# Patient Record
Sex: Female | Born: 1960 | Race: Black or African American | Hispanic: No | Smoking: Former smoker
Health system: Southern US, Community
[De-identification: ages and names within clinical notes are randomized; demographics above are authoritative.]

## PROBLEM LIST (undated history)

## (undated) DIAGNOSIS — E785 Hyperlipidemia, unspecified: Secondary | ICD-10-CM

## (undated) DIAGNOSIS — R519 Headache, unspecified: Secondary | ICD-10-CM

## (undated) DIAGNOSIS — F419 Anxiety disorder, unspecified: Secondary | ICD-10-CM

## (undated) DIAGNOSIS — IMO0002 Reserved for concepts with insufficient information to code with codable children: Secondary | ICD-10-CM

## (undated) DIAGNOSIS — D649 Anemia, unspecified: Secondary | ICD-10-CM

## (undated) DIAGNOSIS — K209 Esophagitis, unspecified: Secondary | ICD-10-CM

## (undated) DIAGNOSIS — M1990 Unspecified osteoarthritis, unspecified site: Secondary | ICD-10-CM

## (undated) DIAGNOSIS — K219 Gastro-esophageal reflux disease without esophagitis: Secondary | ICD-10-CM

## (undated) DIAGNOSIS — K449 Diaphragmatic hernia without obstruction or gangrene: Secondary | ICD-10-CM

## (undated) DIAGNOSIS — K222 Esophageal obstruction: Secondary | ICD-10-CM

## (undated) DIAGNOSIS — I10 Essential (primary) hypertension: Secondary | ICD-10-CM

## (undated) DIAGNOSIS — T7840XA Allergy, unspecified, initial encounter: Secondary | ICD-10-CM

## (undated) DIAGNOSIS — R51 Headache: Secondary | ICD-10-CM

## (undated) DIAGNOSIS — K589 Irritable bowel syndrome without diarrhea: Secondary | ICD-10-CM

## (undated) DIAGNOSIS — K635 Polyp of colon: Secondary | ICD-10-CM

## (undated) DIAGNOSIS — G8929 Other chronic pain: Secondary | ICD-10-CM

## (undated) HISTORY — DX: Esophageal obstruction: K22.2

## (undated) HISTORY — DX: Hyperlipidemia, unspecified: E78.5

## (undated) HISTORY — DX: Essential (primary) hypertension: I10

## (undated) HISTORY — DX: Headache, unspecified: R51.9

## (undated) HISTORY — DX: Headache: R51

## (undated) HISTORY — PX: ANKLE FRACTURE SURGERY: SHX122

## (undated) HISTORY — PX: BUNIONECTOMY: SHX129

## (undated) HISTORY — DX: Reserved for concepts with insufficient information to code with codable children: IMO0002

## (undated) HISTORY — DX: Other chronic pain: G89.29

## (undated) HISTORY — DX: Gastro-esophageal reflux disease without esophagitis: K21.9

## (undated) HISTORY — DX: Diaphragmatic hernia without obstruction or gangrene: K44.9

## (undated) HISTORY — DX: Anemia, unspecified: D64.9

## (undated) HISTORY — DX: Anxiety disorder, unspecified: F41.9

## (undated) HISTORY — PX: ABDOMINAL HYSTERECTOMY: SHX81

## (undated) HISTORY — DX: Polyp of colon: K63.5

## (undated) HISTORY — PX: BREAST CYST EXCISION: SHX579

## (undated) HISTORY — DX: Unspecified osteoarthritis, unspecified site: M19.90

## (undated) HISTORY — DX: Irritable bowel syndrome, unspecified: K58.9

## (undated) HISTORY — DX: Esophagitis, unspecified: K20.9

## (undated) HISTORY — DX: Allergy, unspecified, initial encounter: T78.40XA

## (undated) HISTORY — PX: TOTAL ABDOMINAL HYSTERECTOMY: SHX209

## (undated) HISTORY — PX: KNEE ARTHROSCOPY: SUR90

---

## 1990-06-28 DIAGNOSIS — K449 Diaphragmatic hernia without obstruction or gangrene: Secondary | ICD-10-CM

## 1990-06-28 DIAGNOSIS — K209 Esophagitis, unspecified without bleeding: Secondary | ICD-10-CM

## 1990-06-28 DIAGNOSIS — K222 Esophageal obstruction: Secondary | ICD-10-CM

## 1990-06-28 HISTORY — DX: Diaphragmatic hernia without obstruction or gangrene: K44.9

## 1990-06-28 HISTORY — DX: Esophagitis, unspecified without bleeding: K20.90

## 1990-06-28 HISTORY — DX: Esophageal obstruction: K22.2

## 1998-12-23 ENCOUNTER — Ambulatory Visit (HOSPITAL_COMMUNITY): Admission: RE | Admit: 1998-12-23 | Discharge: 1998-12-23 | Payer: Self-pay | Admitting: Pulmonary Disease

## 1998-12-23 ENCOUNTER — Encounter: Payer: Self-pay | Admitting: Pulmonary Disease

## 1998-12-23 ENCOUNTER — Observation Stay (HOSPITAL_COMMUNITY): Admission: AD | Admit: 1998-12-23 | Discharge: 1998-12-24 | Payer: Self-pay | Admitting: Obstetrics and Gynecology

## 1999-01-02 ENCOUNTER — Ambulatory Visit (HOSPITAL_COMMUNITY): Admission: RE | Admit: 1999-01-02 | Discharge: 1999-01-02 | Payer: Self-pay | Admitting: Obstetrics and Gynecology

## 1999-01-27 ENCOUNTER — Other Ambulatory Visit: Admission: RE | Admit: 1999-01-27 | Discharge: 1999-01-27 | Payer: Self-pay | Admitting: Obstetrics and Gynecology

## 1999-11-12 ENCOUNTER — Inpatient Hospital Stay (HOSPITAL_COMMUNITY): Admission: RE | Admit: 1999-11-12 | Discharge: 1999-11-14 | Payer: Self-pay | Admitting: *Deleted

## 2000-05-09 ENCOUNTER — Other Ambulatory Visit: Admission: RE | Admit: 2000-05-09 | Discharge: 2000-05-09 | Payer: Self-pay | Admitting: *Deleted

## 2001-01-31 ENCOUNTER — Encounter: Admission: RE | Admit: 2001-01-31 | Discharge: 2001-01-31 | Payer: Self-pay | Admitting: Pulmonary Disease

## 2001-01-31 ENCOUNTER — Encounter: Payer: Self-pay | Admitting: Pulmonary Disease

## 2001-06-19 ENCOUNTER — Other Ambulatory Visit: Admission: RE | Admit: 2001-06-19 | Discharge: 2001-06-19 | Payer: Self-pay | Admitting: *Deleted

## 2003-04-11 ENCOUNTER — Other Ambulatory Visit: Admission: RE | Admit: 2003-04-11 | Discharge: 2003-04-11 | Payer: Self-pay | Admitting: *Deleted

## 2005-02-23 ENCOUNTER — Ambulatory Visit: Payer: Self-pay | Admitting: Pulmonary Disease

## 2005-04-26 ENCOUNTER — Ambulatory Visit: Payer: Self-pay | Admitting: Pulmonary Disease

## 2006-03-24 ENCOUNTER — Ambulatory Visit: Payer: Self-pay | Admitting: Pulmonary Disease

## 2006-09-20 ENCOUNTER — Ambulatory Visit: Payer: Self-pay | Admitting: Pulmonary Disease

## 2006-09-20 LAB — CONVERTED CEMR LAB
ALT: 18 units/L (ref 0–40)
AST: 21 units/L (ref 0–37)
Albumin: 4.4 g/dL (ref 3.5–5.2)
Alkaline Phosphatase: 85 units/L (ref 39–117)
BUN: 8 mg/dL (ref 6–23)
Basophils Absolute: 0 10*3/uL (ref 0.0–0.1)
Basophils Relative: 0.6 % (ref 0.0–1.0)
Bilirubin, Direct: 0.1 mg/dL (ref 0.0–0.3)
CO2: 29 meq/L (ref 19–32)
Calcium: 9.7 mg/dL (ref 8.4–10.5)
Chloride: 105 meq/L (ref 96–112)
Cholesterol: 166 mg/dL (ref 0–200)
Creatinine, Ser: 0.6 mg/dL (ref 0.4–1.2)
Eosinophils Absolute: 0.1 10*3/uL (ref 0.0–0.6)
Eosinophils Relative: 1 % (ref 0.0–5.0)
GFR calc Af Amer: 138 mL/min
GFR calc non Af Amer: 114 mL/min
Glucose, Bld: 111 mg/dL — ABNORMAL HIGH (ref 70–99)
HCT: 44.9 % (ref 36.0–46.0)
HDL: 64.5 mg/dL (ref >39.0)
Hemoglobin: 15.4 g/dL — ABNORMAL HIGH (ref 12.0–15.0)
LDL Cholesterol: 85 mg/dL (ref 0–99)
Lymphocytes Relative: 26.5 % (ref 12.0–46.0)
MCHC: 34.2 g/dL (ref 30.0–36.0)
MCV: 87.6 fL (ref 78.0–100.0)
Monocytes Absolute: 0.3 10*3/uL (ref 0.2–0.7)
Monocytes Relative: 4.2 % (ref 3.0–11.0)
Neutro Abs: 5.7 10*3/uL (ref 1.4–7.7)
Neutrophils Relative %: 67.7 % (ref 43.0–77.0)
Platelets: 218 10*3/uL (ref 150–400)
Potassium: 3.9 meq/L (ref 3.5–5.1)
RBC: 5.13 M/uL — ABNORMAL HIGH (ref 3.87–5.11)
RDW: 14.1 % (ref 11.5–14.6)
Sodium: 142 meq/L (ref 135–145)
TSH: 0.97 microintl units/mL (ref 0.35–5.50)
Total Bilirubin: 0.9 mg/dL (ref 0.3–1.2)
Total CHOL/HDL Ratio: 2.6
Total Protein: 7.2 g/dL (ref 6.0–8.3)
Triglycerides: 85 mg/dL (ref 0–149)
VLDL: 17 mg/dL (ref 0–40)
WBC: 8.3 10*3/uL (ref 4.5–10.5)

## 2007-09-15 DIAGNOSIS — K219 Gastro-esophageal reflux disease without esophagitis: Secondary | ICD-10-CM | POA: Insufficient documentation

## 2007-09-15 DIAGNOSIS — E785 Hyperlipidemia, unspecified: Secondary | ICD-10-CM | POA: Insufficient documentation

## 2007-09-15 DIAGNOSIS — Z8719 Personal history of other diseases of the digestive system: Secondary | ICD-10-CM | POA: Insufficient documentation

## 2011-06-29 DEATH — deceased

## 2012-08-01 ENCOUNTER — Encounter: Payer: Self-pay | Admitting: Obstetrics and Gynecology

## 2012-10-06 ENCOUNTER — Encounter: Payer: Self-pay | Admitting: Gastroenterology

## 2012-10-23 ENCOUNTER — Encounter: Payer: Self-pay | Admitting: *Deleted

## 2012-10-27 ENCOUNTER — Encounter: Payer: Self-pay | Admitting: Gastroenterology

## 2012-10-27 ENCOUNTER — Ambulatory Visit (INDEPENDENT_AMBULATORY_CARE_PROVIDER_SITE_OTHER): Payer: Federal, State, Local not specified - PPO | Admitting: Gastroenterology

## 2012-10-27 VITALS — BP 110/86 | HR 80 | Ht 68.25 in | Wt 170.2 lb

## 2012-10-27 DIAGNOSIS — K644 Residual hemorrhoidal skin tags: Secondary | ICD-10-CM

## 2012-10-27 DIAGNOSIS — G8929 Other chronic pain: Secondary | ICD-10-CM

## 2012-10-27 DIAGNOSIS — K589 Irritable bowel syndrome without diarrhea: Secondary | ICD-10-CM

## 2012-10-27 DIAGNOSIS — M549 Dorsalgia, unspecified: Secondary | ICD-10-CM

## 2012-10-27 DIAGNOSIS — K219 Gastro-esophageal reflux disease without esophagitis: Secondary | ICD-10-CM

## 2012-10-27 DIAGNOSIS — Z860100 Personal history of colon polyps, unspecified: Secondary | ICD-10-CM

## 2012-10-27 DIAGNOSIS — R1013 Epigastric pain: Secondary | ICD-10-CM

## 2012-10-27 DIAGNOSIS — Z800 Family history of malignant neoplasm of digestive organs: Secondary | ICD-10-CM

## 2012-10-27 DIAGNOSIS — K5900 Constipation, unspecified: Secondary | ICD-10-CM

## 2012-10-27 MED ORDER — SUCRALFATE 1 G PO TABS: 1.0000 g | ORAL_TABLET | Freq: Every day | ORAL | Status: DC

## 2012-10-27 MED ORDER — ESOMEPRAZOLE MAGNESIUM 40 MG PO CPDR: 40.0000 mg | DELAYED_RELEASE_CAPSULE | Freq: Two times a day (BID) | ORAL | Status: DC

## 2012-10-27 NOTE — Patient Instructions (Addendum)
You have been given a separate informational sheet regarding your tobacco use, the importance of quitting and local resources to help you quit.  You have been scheduled for an endoscopy with propofol. Please follow written instructions given to you at your visit today. If you use inhalers (even only as needed), please bring them with you on the day of your procedure. Your physician has requested that you go to www.startemmi.com and enter the access code given to you at your visit today. This web site gives a general overview about your procedure. However, you should still follow specific instructions given to you by our office regarding your preparation for the procedure.  Carafate was sent to your pharmacy, please take one tablet at bedtime.  Please take Nexium one capsule by mouth twice daily. New prescription   Please purchase Metamucil over the counter. Take as directed.  We will get a copy of your labs and Colonoscopy report from your primary care doctor. _____________________________________________________________________________________________________________________________________   Monica Brooks have been scheduled for an abdominal ultrasound at [facility] (1st floor of hospital) on 10-30-2012 at 8 am. Please arrive 15 minutes prior to your appointment for registration. Make certain not to have anything to eat or drink 6 hours prior to your appointment. Should you need to reschedule your appointment, please contact radiology at [phone]. This test typically takes about 30 minutes to perform.

## 2012-10-27 NOTE — Progress Notes (Signed)
History of Present Illness:  This is a very pleasant 52 year old African American female who has had chronic GERD for many years and takes daily Nexium 40 mg.  For the last 2-3 months she's had  early morning nausea, dyspepsia, and epigastric abdominal pain made worse by use of certain foods.  She's had no emesis, weight loss, melena or hematochezia.  Patient does have a history of external hemorrhoids, but denies rectal bleeding.  Colonoscopy was apparently done at [facility] 2 years ago with 2 polyps removed.  We will request these records for review.  Apparently she also has had recent lab data which was normal, and we will request these records for review.  Apparently saw this patient 34, but cannot find these records for review.  Allegedly that time she had" an ulcer".  She has burning epigastric pain radiating to her chest without referral into her back.  She denies dysphagia, but does have gas and bloating, chronic constipation with occasional diarrhea.  There is no history of known gallbladder liver disease.  Family history is remarkable for colon cancer in her grandmother, and colon polyps in her father.  Does smokes a half a pack of cigarettes per day, but denies ethanol use.  She has chronic low back pain and yakes Percocet regularly with when necessary Celebrex 200 mg.  I have reviewed this patient's present history, medical and surgical past history, allergies and medications.     ROS:   All systems were reviewed and are negative unless otherwise stated in the HPI.Marland KitchenMarland KitchenBlood pressure 110/86, pulse 80, height 5' 8.25" (1.734 m), weight 170 lb 4 oz (77.225 kg), SpO2 98.00%.-shaped right low back pain with herniated disc, pain in her left foot, and a history of carpal tunnel syndrome.  She is status post hysterectomy.  There is no history of fever, chills, or other systemic complaints.    Physical Exam: Blood pressure 110/86, pulse 80 and regular, and weight 170 with a BMI of  25.68.  98% oxygen saturation on room air.  General well developed well nourished patient in no acute distress, appearing their stated age Eyes PERRLA, no icterus, fundoscopic exam per opthamologist Skin no lesions noted Neck supple, no adenopathy, no thyroid enlargement, no tenderness Chest clear to percussion and auscultation Heart no significant murmurs, gallops or rubs noted Abdomen no hepatosplenomegaly masses or tenderness, BS normal.  Rectal inspection normal no fissures, or fistulae noted.  No masses or tenderness on digital exam. Stool guaiac negative.V-shaped scar at umbilicus from previous umbilical hernia repair as a child. Extremities no acute joint lesions, edema, phlebitis or evidence of cellulitis. Neurologic patient oriented x 3, cranial nerves intact, no focal neurologic deficits noted. Psychological mental status normal and normal affect.  Assessment and plan: Chronic constipation predominant IBS, and the patient does use large amount of by mouth sorbitol and fructose, and we will try to remove these products from her diet and use daily Metamucil and liberal by mouth fluids.  Inspection of her rectum does show a large posterior midline hemorrhoid.  As mentioned above, she does have a history of colon polyps and a family history of colon cancer.  I suspect she has worsening acid reflux symptoms, but may have H. pylori infection, and I have scheduled her for endoscopic exam, also upper abdominal ultrasound to exclude cholelithiasis.  I've increased her Nexium to 40 mg twice a day with Carafate 1 g at bedtime.  Is also possible that there is an element of NSAID induced gastropathy  spiked daily PPI use. We have requested  review of her colonoscopy report and laboratory data.  She may need closer followup than every five-year colonoscopy.  No diagnosis found.

## 2012-10-30 ENCOUNTER — Ambulatory Visit (HOSPITAL_COMMUNITY): Admission: RE | Admit: 2012-10-30 | Payer: Federal, State, Local not specified - PPO | Source: Ambulatory Visit

## 2012-10-30 ENCOUNTER — Telehealth: Payer: Self-pay | Admitting: Gastroenterology

## 2012-10-30 NOTE — Telephone Encounter (Signed)
Order faxed Patient aware.  She states that she will call them to set up the appt

## 2012-11-03 ENCOUNTER — Encounter: Payer: Self-pay | Admitting: Gastroenterology

## 2012-11-03 ENCOUNTER — Ambulatory Visit (AMBULATORY_SURGERY_CENTER): Payer: Federal, State, Local not specified - PPO | Admitting: Gastroenterology

## 2012-11-03 VITALS — BP 158/96 | HR 65 | Temp 97.2°F | Resp 19 | Ht 68.5 in | Wt 170.0 lb

## 2012-11-03 DIAGNOSIS — K219 Gastro-esophageal reflux disease without esophagitis: Secondary | ICD-10-CM

## 2012-11-03 DIAGNOSIS — K2970 Gastritis, unspecified, without bleeding: Secondary | ICD-10-CM

## 2012-11-03 DIAGNOSIS — Z800 Family history of malignant neoplasm of digestive organs: Secondary | ICD-10-CM

## 2012-11-03 DIAGNOSIS — R1013 Epigastric pain: Secondary | ICD-10-CM

## 2012-11-03 DIAGNOSIS — K222 Esophageal obstruction: Secondary | ICD-10-CM

## 2012-11-03 MED ORDER — SODIUM CHLORIDE 0.9 % IV SOLN: 500.0000 mL | INTRAVENOUS | Status: DC

## 2012-11-03 NOTE — Progress Notes (Signed)
Patient did not experience any of the following events: a burn prior to discharge; a fall within the facility; wrong site/side/patient/procedure/implant event; or a hospital transfer or hospital admission upon discharge from the facility. (G8907) Patient did not have preoperative order for IV antibiotic SSI prophylaxis. (G8918)  

## 2012-11-03 NOTE — Progress Notes (Signed)
Called to room to assist during endoscopic procedure.  Patient ID and intended procedure confirmed with present staff. Received instructions for my participation in the procedure from the performing physician.  

## 2012-11-03 NOTE — Patient Instructions (Addendum)

## 2012-11-03 NOTE — Op Note (Addendum)
[facility] [address] [place], [address]   ENDOSCOPY PROCEDURE REPORT  PATIENT: Monica, Brooks.  MR#: [id number] BIRTHDATE: 03-13-1961 , 51  yrs. old GENDER: Female ENDOSCOPIST:Janal Haak Hale Bogus, MD, [facility] REFERRED BY: PROCEDURE DATE:  11/03/2012 PROCEDURE:   EGD w/ biopsy for H.pylori and Maloney dilation of esophagus ASA CLASS:    Class II INDICATIONS: Dysphagia and History of reflux esophagitis. MEDICATION: propofol (Diprivan) 150mg  IV TOPICAL ANESTHETIC:   Cetacaine Spray  DESCRIPTION OF PROCEDURE:   After the risks and benefits of the procedure were explained, informed consent was obtained.  The endoscope was introduced through the mouth  and advanced to the second portion of the duodenum .  The instrument was slowly withdrawn as the mucosa was fully examined.      DUODENUM: The duodenal mucosa showed no abnormalities in the bulb and second portion of the duodenum.  STOMACH: There was mild antral gastropathy noted.  Cold forcep biopsies were taken at the antrum.  for CLO testing.  ESOPHAGUS: The mucosa of the esophagus appeared normal.   A stricture was found at the gastroesophageal junction.  The stenosis was traversable with the endoscope.  Dilated #20F  maloney dilator...slight resistance,no heme.  Retroflexed views revealed a small hiatal hernia.    The scope was then withdrawn from the patient and the procedure completed.  COMPLICATIONS: There were no complications.   ENDOSCOPIC IMPRESSION: 1.   The duodenal mucosa showed no abnormalities in the bulb and second portion of the duodenum 2.   There was mild antral gastropathy noted [T2]..r/o H.pylori 3.   The mucosa of the esophagus appeared normal ..pt. on PPi Rx. Chronic GERD 4.   Stricture was found at the gastroesophageal junction ...dilated #20F  maloney dilator...  RECOMMENDATIONS: 1.  Await biopsy results 2.  Clear liquids until 5:00 PM , then soft foods rest iof day. Resume  prior diet tomorrow. 3.  OP follow-up is advised on a PRN basis. 4.  Rx CLO if positive 5.  Continue current meds    _______________________________ eSigned:  Mardella Layman, MD, [facility] 12/06/2012 2:06 PM Revised: 12/06/2012 2:06 PM     PATIENT NAME:  Monica Brooks. MR#: [id number]

## 2012-11-04 ENCOUNTER — Encounter (HOSPITAL_COMMUNITY): Payer: Self-pay | Admitting: Emergency Medicine

## 2012-11-04 ENCOUNTER — Inpatient Hospital Stay (HOSPITAL_COMMUNITY)
Admission: EM | Admit: 2012-11-04 | Discharge: 2012-11-05 | DRG: 452 | Disposition: A | Discharge status: Home / Self Care | Payer: Federal, State, Local not specified - PPO | Attending: Internal Medicine | Admitting: Internal Medicine

## 2012-11-04 ENCOUNTER — Emergency Department (HOSPITAL_COMMUNITY): Payer: Federal, State, Local not specified - PPO

## 2012-11-04 ENCOUNTER — Other Ambulatory Visit: Payer: Self-pay

## 2012-11-04 ENCOUNTER — Telehealth: Payer: Self-pay | Admitting: Internal Medicine

## 2012-11-04 DIAGNOSIS — Z860100 Personal history of colon polyps, unspecified: Secondary | ICD-10-CM

## 2012-11-04 DIAGNOSIS — I369 Nonrheumatic tricuspid valve disorder, unspecified: Secondary | ICD-10-CM

## 2012-11-04 DIAGNOSIS — D62 Acute posthemorrhagic anemia: Secondary | ICD-10-CM | POA: Diagnosis present

## 2012-11-04 DIAGNOSIS — E785 Hyperlipidemia, unspecified: Secondary | ICD-10-CM | POA: Diagnosis present

## 2012-11-04 DIAGNOSIS — K222 Esophageal obstruction: Secondary | ICD-10-CM

## 2012-11-04 DIAGNOSIS — I10 Essential (primary) hypertension: Secondary | ICD-10-CM | POA: Diagnosis present

## 2012-11-04 DIAGNOSIS — Z8711 Personal history of peptic ulcer disease: Secondary | ICD-10-CM

## 2012-11-04 DIAGNOSIS — K920 Hematemesis: Secondary | ICD-10-CM | POA: Diagnosis present

## 2012-11-04 DIAGNOSIS — I517 Cardiomegaly: Secondary | ICD-10-CM | POA: Diagnosis present

## 2012-11-04 DIAGNOSIS — R55 Syncope and collapse: Secondary | ICD-10-CM

## 2012-11-04 DIAGNOSIS — K219 Gastro-esophageal reflux disease without esophagitis: Secondary | ICD-10-CM

## 2012-11-04 DIAGNOSIS — F17200 Nicotine dependence, unspecified, uncomplicated: Secondary | ICD-10-CM | POA: Diagnosis present

## 2012-11-04 DIAGNOSIS — R000 Tachycardia, unspecified: Secondary | ICD-10-CM | POA: Diagnosis present

## 2012-11-04 DIAGNOSIS — Z79899 Other long term (current) drug therapy: Secondary | ICD-10-CM

## 2012-11-04 DIAGNOSIS — IMO0002 Reserved for concepts with insufficient information to code with codable children: Principal | ICD-10-CM | POA: Diagnosis present

## 2012-11-04 DIAGNOSIS — F411 Generalized anxiety disorder: Secondary | ICD-10-CM | POA: Diagnosis present

## 2012-11-04 LAB — CBC
HCT: 28.3 % — ABNORMAL LOW (ref 36.0–46.0)
HCT: 26.9 % — ABNORMAL LOW (ref 36.0–46.0)
HCT: 26.4 % — ABNORMAL LOW (ref 36.0–46.0)
Hemoglobin: 9.6 g/dL — ABNORMAL LOW (ref 12.0–15.0)
Hemoglobin: 8.8 g/dL — ABNORMAL LOW (ref 12.0–15.0)
Hemoglobin: 8.8 g/dL — ABNORMAL LOW (ref 12.0–15.0)
MCH: 30.2 pg (ref 26.0–34.0)
MCH: 29.7 pg (ref 26.0–34.0)
MCHC: 33.9 g/dL (ref 30.0–36.0)
MCHC: 33.3 g/dL (ref 30.0–36.0)
MCV: 89 fL (ref 78.0–100.0)
MCV: 89.2 fL (ref 78.0–100.0)
Platelets: 171 10*3/uL (ref 150–400)
Platelets: 175 10*3/uL (ref 150–400)
RBC: 3.18 MIL/uL — ABNORMAL LOW (ref 3.87–5.11)
RBC: 2.96 MIL/uL — ABNORMAL LOW (ref 3.87–5.11)
RDW: 13.3 % (ref 11.5–15.5)
RDW: 13.8 % (ref 11.5–15.5)
WBC: 10.1 10*3/uL (ref 4.0–10.5)
WBC: 9.2 10*3/uL (ref 4.0–10.5)
WBC: 10.7 10*3/uL — ABNORMAL HIGH (ref 4.0–10.5)

## 2012-11-04 LAB — CBC WITH DIFFERENTIAL/PLATELET
Eosinophils Absolute: 0 10*3/uL (ref 0.0–0.7)
Eosinophils Relative: 0 % (ref 0–5)
HCT: 37.1 % (ref 36.0–46.0)
Lymphocytes Relative: 21 % (ref 12–46)
Lymphs Abs: 2.8 10*3/uL (ref 0.7–4.0)
MCH: 30.6 pg (ref 26.0–34.0)
MCV: 89.4 fL (ref 78.0–100.0)
Monocytes Absolute: 0.7 10*3/uL (ref 0.1–1.0)
Platelets: 226 10*3/uL (ref 150–400)
RBC: 4.15 MIL/uL (ref 3.87–5.11)
RDW: 13.4 % (ref 11.5–15.5)

## 2012-11-04 LAB — POCT I-STAT, CHEM 8
BUN: 38 mg/dL — ABNORMAL HIGH (ref 6–23)
Creatinine, Ser: 0.6 mg/dL (ref 0.50–1.10)
Glucose, Bld: 139 mg/dL — ABNORMAL HIGH (ref 70–99)
Hemoglobin: 11.9 g/dL — ABNORMAL LOW (ref 12.0–15.0)
Potassium: 3.6 mEq/L (ref 3.5–5.1)
Sodium: 143 mEq/L (ref 135–145)

## 2012-11-04 LAB — BASIC METABOLIC PANEL
CO2: 28 mEq/L (ref 19–32)
Calcium: 9.2 mg/dL (ref 8.4–10.5)
Chloride: 104 mEq/L (ref 96–112)
Creatinine, Ser: 0.57 mg/dL (ref 0.50–1.10)
Glucose, Bld: 137 mg/dL — ABNORMAL HIGH (ref 70–99)

## 2012-11-04 LAB — TROPONIN I
Troponin I: <0.3 ng/mL (ref <0.30)
Troponin I: <0.3 ng/mL
Troponin I: <0.3 ng/mL (ref <0.30)

## 2012-11-04 LAB — MRSA PCR SCREENING: MRSA by PCR: NEGATIVE

## 2012-11-04 LAB — TYPE AND SCREEN: ABO/RH(D): O POS

## 2012-11-04 MED ORDER — PANTOPRAZOLE SODIUM 40 MG IV SOLR
40.0000 mg | Freq: Two times a day (BID) | INTRAVENOUS | Status: DC
  Administered 2012-11-04 (×2): 40 mg via INTRAVENOUS
  Filled 2012-11-04 (×5): qty 40

## 2012-11-04 MED ORDER — PANTOPRAZOLE SODIUM 40 MG IV SOLR
40.0000 mg | Freq: Once | INTRAVENOUS | Status: AC
  Administered 2012-11-04: 40 mg via INTRAVENOUS
  Filled 2012-11-04: qty 40

## 2012-11-04 MED ORDER — ONDANSETRON HCL 4 MG PO TABS: 4.0000 mg | ORAL_TABLET | Freq: Four times a day (QID) | ORAL | Status: DC | PRN

## 2012-11-04 MED ORDER — OXYCODONE-ACETAMINOPHEN 5-325 MG PO TABS
1.0000 | ORAL_TABLET | ORAL | Status: DC | PRN
Start: 2012-11-04 — End: 2012-11-05

## 2012-11-04 MED ORDER — ACETAMINOPHEN 325 MG PO TABS: 650.0000 mg | ORAL_TABLET | Freq: Four times a day (QID) | ORAL | Status: DC | PRN

## 2012-11-04 MED ORDER — SODIUM CHLORIDE 0.9 % IV BOLUS (SEPSIS)
1000.0000 mL | Freq: Once | INTRAVENOUS | Status: AC
  Administered 2012-11-04: 1000 mL via INTRAVENOUS

## 2012-11-04 MED ORDER — ONDANSETRON HCL 4 MG/2ML IJ SOLN
4.0000 mg | Freq: Once | INTRAMUSCULAR | Status: AC
  Administered 2012-11-04: 4 mg via INTRAVENOUS
  Filled 2012-11-04: qty 2

## 2012-11-04 MED ORDER — AMITRIPTYLINE HCL 50 MG PO TABS
50.0000 mg | ORAL_TABLET | Freq: Every day | ORAL | Status: DC
  Administered 2012-11-04: 50 mg via ORAL
  Filled 2012-11-04 (×3): qty 1

## 2012-11-04 MED ORDER — ACETAMINOPHEN 650 MG RE SUPP: 650.0000 mg | Freq: Four times a day (QID) | RECTAL | Status: DC | PRN

## 2012-11-04 MED ORDER — SODIUM CHLORIDE 0.9 % IJ SOLN
3.0000 mL | Freq: Two times a day (BID) | INTRAMUSCULAR | Status: DC
  Administered 2012-11-04: 3 mL via INTRAVENOUS

## 2012-11-04 MED ORDER — SUCRALFATE 1 G PO TABS
1.0000 g | ORAL_TABLET | Freq: Every day | ORAL | Status: DC
  Administered 2012-11-04: 1 g via ORAL
  Filled 2012-11-04 (×3): qty 1

## 2012-11-04 MED ORDER — SODIUM CHLORIDE 0.9 % IV SOLN
INTRAVENOUS | Status: AC
  Administered 2012-11-04 (×2): 1000 mL via INTRAVENOUS

## 2012-11-04 MED ORDER — ATORVASTATIN CALCIUM 80 MG PO TABS
80.0000 mg | ORAL_TABLET | Freq: Every day | ORAL | Status: DC
  Administered 2012-11-04: 80 mg via ORAL
  Filled 2012-11-04 (×2): qty 1

## 2012-11-04 MED ORDER — ONDANSETRON HCL 4 MG/2ML IJ SOLN
4.0000 mg | Freq: Four times a day (QID) | INTRAMUSCULAR | Status: DC | PRN
  Administered 2012-11-04: 4 mg via INTRAVENOUS
  Filled 2012-11-04: qty 2

## 2012-11-04 MED ORDER — PNEUMOCOCCAL VAC POLYVALENT 25 MCG/0.5ML IJ INJ
0.5000 mL | INJECTION | INTRAMUSCULAR | Status: AC
  Administered 2012-11-05: 0.5 mL via INTRAMUSCULAR
  Filled 2012-11-04 (×2): qty 0.5

## 2012-11-04 NOTE — ED Provider Notes (Signed)
History     CSN: [id number]  Arrival date & time 11/04/12  0245   First MD Initiated Contact with Patient 11/04/12 0257      Chief Complaint  Patient presents with  . Hematemesis    Patient is a 52 y.o. female presenting with vomiting. The history is provided by the patient.  Emesis Severity:  Moderate Timing:  Intermittent Quality:  Bright red blood Progression:  Worsening Chronicity:  New Relieved by:  Nothing Worsened by:  Nothing tried Associated symptoms: abdominal pain   Associated symptoms: no diarrhea   pt presents for vomiting blood She reports she had EGD on 05/09,  No complications, went home and then she started to vomit bright red blood.  She reports she had a syncopal episode as well.  She reports she has abdominal discomfort.  No SOB is reported.   She denies any trauma from her syncopal episode She reports she had not had any hematemesis previously No recent rectal bleeding  She had EGD with biopsy and dilation on 05/09  Past Medical History  Diagnosis Date  . Stricture of esophagus 1992    EGD---Dr. Corinda Gubler   . Esophagitis 1992    EGD---Dr. Corinda Gubler   . Sliding hiatal hernia 1992    EGD---Dr. Corinda Gubler   . Arthritis   . Chronic headaches   . Colon polyps   . GERD (gastroesophageal reflux disease)   . HLD (hyperlipidemia)   . IBS (irritable bowel syndrome)   . Allergy   . Anemia   . Anxiety   . HTN (hypertension)     no meds  . Ulcer     Past Surgical History  Procedure Laterality Date  . Knee arthroscopy Left   . Ankle fracture surgery Left   . Bunionectomy Left     with tendon release  . Abdominal hysterectomy    . Breast cyst excision Right     Family History  Problem Relation Age of Onset  . Breast cancer Mother   . Prostate cancer Father   . Colon polyps Father   . Colon cancer Maternal Grandmother   . Diabetes Maternal Grandmother   . Diabetes Sister   . Diabetes Maternal Aunt   . Esophageal cancer Neg Hx   . Stomach cancer  Neg Hx   . Rectal cancer Neg Hx     History  Substance Use Topics  . Smoking status: Current Every Day Smoker -- 0.50 packs/day for 25 years    Types: Cigarettes  . Smokeless tobacco: Never Used  . Alcohol Use: No    OB History   Grav Para Term Preterm Abortions TAB SAB Ect Mult Living                  Review of Systems  Constitutional: Positive for fatigue.  Respiratory: Negative for shortness of breath.   Gastrointestinal: Positive for vomiting and abdominal pain. Negative for diarrhea and blood in stool.  Genitourinary: Negative for vaginal bleeding.  Neurological: Positive for syncope and weakness.  All other systems reviewed and are negative.    Allergies  Aspirin; Amoxicillin-pot clavulanate; and Sulfa antibiotics  Home Medications   Current Outpatient Rx  Name  Route  Sig  Dispense  Refill  . amitriptyline (ELAVIL) 50 MG tablet   Oral   Take 50 mg by mouth at bedtime.         . celecoxib (CELEBREX) 200 MG capsule   Oral   Take 200 mg by mouth as needed for  pain.         . esomeprazole (NEXIUM) 40 MG capsule   Oral   Take 1 capsule (40 mg total) by mouth 2 (two) times daily.   60 capsule   2   . oxyCODONE-acetaminophen (PERCOCET/ROXICET) 5-325 MG per tablet   Oral   Take 1 tablet by mouth 2 (two) times daily as needed for pain.         . rosuvastatin (CRESTOR) 40 MG tablet   Oral   Take 40 mg by mouth daily.         . sucralfate (CARAFATE) 1 G tablet   Oral   Take 1 tablet (1 g total) by mouth at bedtime.   30 tablet   1   . Vitamin D, Ergocalciferol, (DRISDOL) 50000 UNITS CAPS   Oral   Take 50,000 Units by mouth every 7 (seven) days.           BP 119/93  Pulse 121  Temp(Src) 98.2 F (36.8 C) (Oral)  Resp 13  SpO2 98%  Physical Exam CONSTITUTIONAL: Well developed/well nourished HEAD: Normocephalic/atraumatic EYES: EOMI/PERRL, conjunctiva pink ENMT: Mucous membranes moist NECK: supple no meningeal signs SPINE:no evidence  of trauma noted CV: S1/S2 noted, no murmurs/rubs/gallops noted LUNGS: Lungs are clear to auscultation bilaterally, no apparent distress ABDOMEN: soft, nontender, no rebound or guarding GU:no cva tenderness Rectal -no stool noted.  No blood noted.  No melena.  Chaperone present NEURO: Pt is awake/alert, moves all extremitiesx4 EXTREMITIES: pulses normal, full ROM SKIN: warm, color normal PSYCH: no abnormalities of mood noted  ED Course  Procedures   Labs Reviewed  POCT I-STAT, CHEM 8 - Abnormal; Notable for the following:    BUN 38 (*)    Glucose, Bld 139 (*)    Hemoglobin 11.9 (*)    HCT 35.0 (*)    All other components within normal limits  BASIC METABOLIC PANEL  CBC WITH DIFFERENTIAL  TYPE AND SCREEN  I spoke to dr Marina Goodell with GI He recommends CXR to eval for any esophageal perforation Also recommends admission for monitoring for any further blood loss Pt currently stable  4:49 AM D/w triad to admit to stepdown Pt currently stable  MDM  Nursing notes including past medical history and social history reviewed and considered in documentation Labs/vital reviewed and considered Previous records reviewed and considered xrays reviewed and considered         Date: 11/04/2012  Rate: 109  Rhythm: sinus tachycardia  QRS Axis: normal  Intervals: normal  ST/T Wave abnormalities: nonspecific ST changes  Conduction Disutrbances:none  Joya Gaskins, MD 11/04/12 [phone]

## 2012-11-04 NOTE — ED Notes (Signed)
ZOX:WRUE<AV> Expected date:11/04/12<BR> Expected time:<BR> Means of arrival:<BR> Comments:<BR> Clean

## 2012-11-04 NOTE — ED Notes (Signed)
Pt returned home @ 1700 yesterday from upper endo, began vomiting bright red blood @ 2000 x 5. Syncope x 2 at home, denies injury.

## 2012-11-04 NOTE — Telephone Encounter (Signed)
Patient called. Had EGD / Elease Hashimoto dilation with Dr Jarold Motto today (reviewed). Reports vomiting blood and "passig out" 7 hours ago and one hour ago. Sounds well on phone, but also c/o abdominal pain. Told to go to ER for evaluation by EDP. She was agreeable.

## 2012-11-04 NOTE — H&P (Signed)
PCP:  NNADI,VICTORIA, MD Jeanne Ivan   Chief Complaint:  hematemesis  HPI: Monica Brooks is a 52 y.o. female   has a past medical history of Stricture of esophagus (1992); Esophagitis (1992); Sliding hiatal hernia (1992); Arthritis; Chronic headaches; Colon polyps; GERD (gastroesophageal reflux disease); HLD (hyperlipidemia); IBS (irritable bowel syndrome); Allergy; Anemia; Anxiety; HTN (hypertension); and Ulcer.   Presented with  5/9 she had an EGD done with dilatation of stricture and biopsy done by Dr. Jarold Motto with [facility]. Few hours later she had an episode of hematemesis productive of bright red blood. Patient states she has vomitted about 3 cups of blood. Soon there after she became light-headed and syncopized. She eventually was able to get to the phone and called for help on arrival to ER she was still noted to be tachycardic but is feeling better. ER MD has discussed her case with [facility] on call and at his point she is being admitted by hospitalist for further monitoring.      Review of Systems:    Pertinent positives include: chills, palpitations, nausea, vomiting, hematemesis  Constitutional:  No weight loss, night sweats, Fevers,  fatigue, weight loss  HEENT:  No headaches, Difficulty swallowing,Tooth/dental problems,Sore throat,  No sneezing, itching, ear ache, nasal congestion, post nasal drip,  Cardio-vascular:  No chest pain, Orthopnea, PND, anasarca, dizziness, .no Bilateral lower extremity swelling  GI:  No heartburn, indigestion, abdominal pain,  diarrhea, change in bowel habits, loss of appetite, melena, blood in stool,  Resp:  no shortness of breath at rest. No dyspnea on exertion, No excess mucus, no productive cough, No non-productive cough, No coughing up of blood.No change in color of mucus.No wheezing. Skin:  no rash or lesions. No jaundice GU:  no dysuria, change in color of urine, no urgency or frequency. No straining to urinate.  No flank pain.   Musculoskeletal:  No joint pain or no joint swelling. No decreased range of motion. No back pain.  Psych:  No change in mood or affect. No depression or anxiety. No memory loss.  Neuro: no localizing neurological complaints, no tingling, no weakness, no double vision, no gait abnormality, no slurred speech, no confusion  Otherwise ROS are negative except for above, 10 systems were reviewed  Past Medical History: Past Medical History  Diagnosis Date  . Stricture of esophagus 1992    EGD---Dr. Corinda Gubler   . Esophagitis 1992    EGD---Dr. Corinda Gubler   . Sliding hiatal hernia 1992    EGD---Dr. Corinda Gubler   . Arthritis   . Chronic headaches   . Colon polyps   . GERD (gastroesophageal reflux disease)   . HLD (hyperlipidemia)   . IBS (irritable bowel syndrome)   . Allergy   . Anemia   . Anxiety   . HTN (hypertension)     no meds  . Ulcer    Past Surgical History  Procedure Laterality Date  . Knee arthroscopy Left   . Ankle fracture surgery Left   . Bunionectomy Left     with tendon release  . Abdominal hysterectomy    . Breast cyst excision Right      Medications: Prior to Admission medications   Medication Sig Start Date End Date Taking? Authorizing Provider  amitriptyline (ELAVIL) 50 MG tablet Take 50 mg by mouth at bedtime.   Yes Historical Provider, MD  celecoxib (CELEBREX) 200 MG capsule Take 200 mg by mouth daily as needed for pain.    Yes Historical Provider, MD  esomeprazole (NEXIUM)  40 MG capsule Take 1 capsule (40 mg total) by mouth 2 (two) times daily. 10/27/12  Yes Mardella Layman, MD  oxyCODONE-acetaminophen (PERCOCET/ROXICET) 5-325 MG per tablet Take 1 tablet by mouth 2 (two) times daily as needed for pain.   Yes Historical Provider, MD  rosuvastatin (CRESTOR) 40 MG tablet Take 40 mg by mouth daily.   Yes Historical Provider, MD  sucralfate (CARAFATE) 1 G tablet Take 1 tablet (1 g total) by mouth at bedtime. 10/27/12  Yes Mardella Layman, MD  Vitamin D,  Ergocalciferol, (DRISDOL) 50000 UNITS CAPS Take 50,000 Units by mouth every 7 (seven) days. tuesday   Yes Historical Provider, MD    Allergies:   Allergies  Allergen Reactions  . Aspirin     Upset stomach  . Amoxicillin-Pot Clavulanate Rash  . Sulfa Antibiotics Itching and Rash    Social History:  Ambulatory   Independently  Lives at home alone   reports that she has been smoking Cigarettes.  She has a 12.5 pack-year smoking history. She has never used smokeless tobacco. She reports that she does not drink alcohol or use illicit drugs.   Family History: family history includes Breast cancer in her mother; Colon cancer in her maternal grandmother; Colon polyps in her father; Diabetes in her maternal aunt, maternal grandmother, and sister; and Prostate cancer in her father.  There is no history of Esophageal cancer, and Stomach cancer, and Rectal cancer, .    Physical Exam: Patient Vitals for the past 24 hrs:  BP Temp Temp src Pulse Resp SpO2  11/04/12 0414 - - - 113 24 99 %  11/04/12 0413 108/72 mmHg - - - 24 -  11/04/12 0304 119/93 mmHg - - 121 15 98 %  11/04/12 0248 119/93 mmHg 98.2 F (36.8 C) Oral 121 13 98 %    1. General:  in No Acute distress 2. Psychological: Alert and   Oriented 3. Head/ENT:     Dry Mucous Membranes                          Head Non traumatic, neck supple                          Normal   Dentition 4. SKIN:   decreased Skin turgor,  Skin clean Dry and intact no rash 5. Heart: rapid Regular rate and rhythm no Murmur, Rub or gallop 6. Lungs: Clear to auscultation bilaterally, no wheezes or crackles   7. Abdomen: Soft, and slight epigastric tenderness, Non distended 8. Lower extremities: no clubbing, cyanosis, or edema 9. Neurologically Grossly intact, moving all 4 extremities equally 10. MSK: Normal range of motion  body mass index is unknown because there is no weight on file.   Labs on Admission:   Recent Labs  11/04/12 0322  11/04/12 0327  NA 143 142  K 3.6 4.1  CL 107 104  CO2  --  28  GLUCOSE 139* 137*  BUN 38* 36*  CREATININE 0.60 0.57  CALCIUM  --  9.2   No results found for this basename: AST, ALT, ALKPHOS, BILITOT, PROT, ALBUMIN,  in the last 72 hours No results found for this basename: LIPASE, AMYLASE,  in the last 72 hours  Recent Labs  11/04/12 0322 11/04/12 0327  WBC  --  13.2*  NEUTROABS  --  9.7*  HGB 11.9* 12.7  HCT 35.0* 37.1  MCV  --  89.4  PLT  --  226   No results found for this basename: CKTOTAL, CKMB, CKMBINDEX, TROPONINI,  in the last 72 hours No results found for this basename: TSH, T4TOTAL, FREET3, T3FREE, THYROIDAB,  in the last 72 hours No results found for this basename: VITAMINB12, FOLATE, FERRITIN, TIBC, IRON, RETICCTPCT,  in the last 72 hours No results found for this basename: HGBA1C    The CrCl is unknown because both a height and weight (above a minimum accepted value) are required for this calculation. ABG    Component Value Date/Time   TCO2 25 11/04/2012 0322     No results found for this basename: DDIMER     Other results:  I have pearsonaly reviewed this: ECG REPORT  Rate: 109  Rhythm: sinus tachycardia ST&T Change: no ischemic changes Cultures: No results found for this basename: sdes, specrequest, cult, reptstatus       Radiological Exams on Admission: Dg Chest Portable 1 View  11/04/2012  *RADIOLOGY REPORT*  Clinical Data: Patient vomiting blood after endoscopy.  PORTABLE CHEST - 1 VIEW  Comparison: None.  Findings: Lungs clear.  Heart size normal.  No pneumothorax or pleural effusion.  No free intraperitoneal air is identified.  No focal bony abnormality.  IMPRESSION: Negative exam.   Original Report Authenticated By: Holley Dexter, M.D.     Chart has been reviewed  Assessment/Plan  52 year old female bases hematemesis after undergoing EGD with biopsy and dilatation, with episode of syncope Present on Admission:  . Hematemesis -will  admit to step down. Full serial CBCs. Protonix IV. GI is aware of the patient. N.p.o. in case she would need the studies. Patient had a chest x-ray which did not show any evidence of his esophageal perforation  . Esophageal reflux - Protonix and Carafate  . Syncope - syncope workup most likely secondary to vasovagal reaction, but we'll monitor in telemetry, cycle cardiac enzymes, obtain echo gram, and carotid Dopplers   Prophylaxis: SCD Protonix  CODE STATUS: FULL CODE  Other plan as per orders.  I have spent a total of  55 min on this admission  Dylin Breeden 11/04/2012, 4:54 AM

## 2012-11-04 NOTE — Progress Notes (Signed)
Monica Brooks 1240 TRANSFERRED TO 1335 IN [facility]. WITH NT. ON RA. NO APPARENT CHANGE IN CONDITION. NO S/S BLEEDING TODAY. REPORT GIVEN TO SARAH RN ON 3W.

## 2012-11-04 NOTE — Progress Notes (Signed)
[facility] PROGRESS NOTE  Monica Brooks ZOX:[id number] DOB: 09-Aug-1960 DOA: 11/04/2012 PCP: Damien Fusi, MD  HPI: 5/9 she had an EGD done with dilatation of stricture and biopsy done by Dr. Jarold Motto with [facility] on 11/03/2012. Few hours later she had an episode of hematemesis productive of bright red blood. Patient states she has vomitted about 3 cups of blood. Soon there after she became light-headed and syncopized. She eventually was able to get to the phone and called for help on arrival to ER she was still noted to be tachycardic but is feeling better. ER MD has discussed her case with [facility] on call and at his point she is being admitted by hospitalist for further monitoring.   Assessment/Plan: . Hematemesis - no more overnight episodes - Hb down this morning, looks somewhat dilutional as well - no BMs yet - appreciate GI input.   . Esophageal reflux - Protonix and Carafate   . Syncope - syncope workup most likely secondary to vasovagal reaction,  - troponin negative - 2D echo pending.   Code Status: Full Family Communication: none  Disposition Plan: pending GI evaluation  Consultants:  GI - [place]  Procedures:  none  Antibiotics:  Anti-infectives   None     Antibiotics Given (last 72 hours)   None     HPI/Subjective: No complaints this morning  Objective: Filed Vitals:   11/04/12 0604 11/04/12 0605 11/04/12 0636 11/04/12 0800  BP: 131/91 121/85 148/77 115/78  Pulse: 88 98 80 77  Temp:   98.6 F (37 C) 98.4 F (36.9 C)  TempSrc:   Oral Oral  Resp:   13 18  Height:   5\' 8"  (1.727 m)   Weight:   75.433 kg (166 lb 4.8 oz)   SpO2:   100% 100%    Intake/Output Summary (Last 24 hours) at 11/04/12 1017 Last data filed at 11/04/12 0900  Gross per 24 hour  Intake    250 ml  Output    425 ml  Net   -175 ml   Filed Weights   11/04/12 0636  Weight: 75.433 kg (166 lb 4.8 oz)    Exam:   General:  NAD  Cardiovascular: regular rate and rhythm,  without MRG  Respiratory: good air movement, clear to auscultation throughout, no wheezing, ronchi or rales  Abdomen: soft, not tender to palpation, positive bowel sounds  MSK: no peripheral edema  Neuro: CN 2-12 grossly intact, MS 5/5 in all 4  Data Reviewed: Basic Metabolic Panel:  Recent Labs Lab 11/04/12 0322 11/04/12 0327  NA 143 142  K 3.6 4.1  CL 107 104  CO2  --  28  GLUCOSE 139* 137*  BUN 38* 36*  CREATININE 0.60 0.57  CALCIUM  --  9.2   CBC:  Recent Labs Lab 11/04/12 0322 11/04/12 0327 11/04/12 0711  WBC  --  13.2* 10.1  NEUTROABS  --  9.7*  --   HGB 11.9* 12.7 9.6*  HCT 35.0* 37.1 28.3*  MCV  --  89.4 89.0  PLT  --  226 171   Cardiac Enzymes:  Recent Labs Lab 11/04/12 0711  TROPONINI <0.30   Studies: Dg Chest Portable 1 View  11/04/2012  *RADIOLOGY REPORT*  Clinical Data: Patient vomiting blood after endoscopy.  PORTABLE CHEST - 1 VIEW  Comparison: None.  Findings: Lungs clear.  Heart size normal.  No pneumothorax or pleural effusion.  No free intraperitoneal air is identified.  No focal bony abnormality.  IMPRESSION: Negative exam.  Original Report Authenticated By: Holley Dexter, M.D.     Scheduled Meds: . amitriptyline  50 mg Oral QHS  . atorvastatin  80 mg Oral q1800  . pantoprazole (PROTONIX) IV  40 mg Intravenous Q12H  . sodium chloride  3 mL Intravenous Q12H  . sucralfate  1 g Oral QHS   Continuous Infusions: . sodium chloride 1,000 mL (11/04/12 0737)    Active Problems:   Esophageal reflux   Hematemesis   Syncope  Time spent: 35  Pamella Pert, MD [facility] Pager [phone]. If 7 PM - 7 AM, please contact night-coverage at [email or web address], password [facility] 11/04/2012, 10:17 AM  LOS: 0 days

## 2012-11-04 NOTE — ED Notes (Signed)
Pt taken to BR via WC, pt c/o being dizzy when standing. Orthostatic VS sitting and standing completed. Pt assisted back to stretcher, side rails up for safety. Nausea improved

## 2012-11-04 NOTE — ED Notes (Signed)
Admitting physician at bedside

## 2012-11-04 NOTE — Consult Note (Signed)
Referring Provider: [facility] Primary Care Physician:  Damien Fusi, MD Primary Gastroenterologist:  Domenick Bookbinder  Reason for Consultation:  Hematemesis  HPI: Monica Brooks is a 52 y.o. female known to Dr. Jarold Motto with history of chronic GERD and esophageal stricture. She also has history of IBS, hypertension, colon polyps, and prior peptic ulcer disease. She had undergone upper endoscopy and esophageal dilation yesterday 11/03/2012 for complaints of dysphagia and had dilation of a distal esophageal stricture with #56 Elease Hashimoto. She tolerated the procedure well and was discharged home. Apparently a few hours after arriving home she developed nausea vomiting and hematemesis of bright red blood. She states that she threw up about 3 cups of blood at home and had a syncopal episode. She presented to the emergency room last evening was tachycardic but not hypotensive at that time area she has not had any further evidence of hematemesis and has not had any melena. She was documented to be Hemoccult-negative in the emergency room . Initial hemoglobin was 12.7. Hemoglobin dropped to 9.6 and then 8.8 this morning. Currently she feels much better, has been hemodynamically stable and denies any dizziness lightheadedness etc. She has not had any indigestion heartburn or chest pain and denies any abdominal pain . Plain films show no evidence of perforation.   Past Medical History  Diagnosis Date  . Stricture of esophagus 1992    EGD---Dr. Corinda Gubler   . Esophagitis 1992    EGD---Dr. Corinda Gubler   . Sliding hiatal hernia 1992    EGD---Dr. Corinda Gubler   . Arthritis   . Chronic headaches   . Colon polyps   . GERD (gastroesophageal reflux disease)   . HLD (hyperlipidemia)   . IBS (irritable bowel syndrome)   . Allergy   . Anemia   . Anxiety   . HTN (hypertension)     no meds  . Ulcer     Past Surgical History  Procedure Laterality Date  . Knee arthroscopy Left   . Ankle fracture surgery  Left   . Bunionectomy Left     with tendon release  . Abdominal hysterectomy    . Breast cyst excision Right     Prior to Admission medications   Medication Sig Start Date End Date Taking? Authorizing Provider  amitriptyline (ELAVIL) 50 MG tablet Take 50 mg by mouth at bedtime.   Yes Historical Provider, MD  celecoxib (CELEBREX) 200 MG capsule Take 200 mg by mouth daily as needed for pain.    Yes Historical Provider, MD  esomeprazole (NEXIUM) 40 MG capsule Take 1 capsule (40 mg total) by mouth 2 (two) times daily. 10/27/12  Yes Mardella Layman, MD  oxyCODONE-acetaminophen (PERCOCET/ROXICET) 5-325 MG per tablet Take 1 tablet by mouth 2 (two) times daily as needed for pain.   Yes Historical Provider, MD  rosuvastatin (CRESTOR) 40 MG tablet Take 40 mg by mouth daily.   Yes Historical Provider, MD  sucralfate (CARAFATE) 1 G tablet Take 1 tablet (1 g total) by mouth at bedtime. 10/27/12  Yes Mardella Layman, MD  Vitamin D, Ergocalciferol, (DRISDOL) 50000 UNITS CAPS Take 50,000 Units by mouth every 7 (seven) days. tuesday   Yes Historical Provider, MD    Current Facility-Administered Medications  Medication Dose Route Frequency Provider Last Rate Last Dose  . 0.9 %  sodium chloride infusion   Intravenous Continuous Therisa Doyne, MD 125 mL/hr at 11/04/12 0737 1,000 mL at 11/04/12 0737  . acetaminophen (TYLENOL) tablet 650 mg  650 mg Oral Q6H PRN Anastassia  Doutova, MD       Or  . acetaminophen (TYLENOL) suppository 650 mg  650 mg Rectal Q6H PRN Therisa Doyne, MD      . amitriptyline (ELAVIL) tablet 50 mg  50 mg Oral QHS Therisa Doyne, MD      . atorvastatin (LIPITOR) tablet 80 mg  80 mg Oral q1800 Therisa Doyne, MD      . ondansetron (ZOFRAN) tablet 4 mg  4 mg Oral Q6H PRN Therisa Doyne, MD       Or  . ondansetron (ZOFRAN) injection 4 mg  4 mg Intravenous Q6H PRN Therisa Doyne, MD   4 mg at 11/04/12 0848  . oxyCODONE-acetaminophen (PERCOCET/ROXICET) 5-325 MG per  tablet 1 tablet  1 tablet Oral Q4H PRN Therisa Doyne, MD      . pantoprazole (PROTONIX) injection 40 mg  40 mg Intravenous Q12H Therisa Doyne, MD   40 mg at 11/04/12 1146  . sodium chloride 0.9 % injection 3 mL  3 mL Intravenous Q12H Therisa Doyne, MD      . sucralfate (CARAFATE) tablet 1 g  1 g Oral QHS Therisa Doyne, MD        Allergies as of 11/04/2012 - Review Complete 11/04/2012  Allergen Reaction Noted  . Aspirin  09/15/2007  . Amoxicillin-pot clavulanate Rash 09/15/2007  . Sulfa antibiotics Itching and Rash 10/27/2012    Family History  Problem Relation Age of Onset  . Breast cancer Mother   . Prostate cancer Father   . Colon polyps Father   . Colon cancer Maternal Grandmother   . Diabetes Maternal Grandmother   . Diabetes Sister   . Diabetes Maternal Aunt   . Esophageal cancer Neg Hx   . Stomach cancer Neg Hx   . Rectal cancer Neg Hx     History   Social History  . Marital Status: Single    Spouse Name: N/A    Number of Children: 2  . Years of Education: N/A   Occupational History  . management    Social History Main Topics  . Smoking status: Current Every Day Smoker -- 0.50 packs/day for 25 years    Types: Cigarettes  . Smokeless tobacco: Never Used  . Alcohol Use: No  . Drug Use: No  . Sexually Active: Not on file   Other Topics Concern  . Not on file   Social History Narrative  . No narrative on file    Review of Systems: Gen: Denies any fever, chills, sweats, anorexia,d, adrenal function.  Physical Exam: Vital signs in last 24 hours: Temp:  [98.2 F (36.8 C)-99.1 F (37.3 C)] 99.1 F (37.3 C) (05/10 1216) Pulse Rate:  [64-121] 78 (05/10 1200) Resp:  [13-24] 22 (05/10 1200) BP: (101-158)/(70-96) 110/76 mmHg (05/10 1200) SpO2:  [98 %-100 %] 100 % (05/10 1200) Weight:  [166 lb 4.8 oz (75.433 kg)] 166 lb 4.8 oz (75.433 kg) (05/10 0636) Last BM Date: 11/03/12 General:   Alert,  Well-developed, well-nourished, African  American female, pleasant and cooperative in NAD Head:  Normocephalic and atraumatic. Eyes:  Sclera clear, no icterus.   Conjunctiva pink. Ears:  Normal auditory acuity. Nose:  No deformity, discharge,  or lesions. Mouth:  No deformity or lesions.   Neck:  Supple; no masses or thyromegaly. Lungs:  Clear throughout to auscultation.   No wheezes, crackles, or rhonchi. Heart:  Regular rate and rhythm; no murmurs, clicks, rubs,  or gallops. Abdomen:  Soft,nontender, BS active,nonpalp mass or hsm.   Rectal:  Deferred -documented Hemoccult negative in the emergency room Msk:  Symmetrical without gross deformities. . Pulses:  Normal pulses noted. Extremities:  Without clubbing or edema. Neurologic:  Alert and  oriented x4;  grossly normal neurologically. Skin:  Intact without significant lesions or rashes.. Psych:  Alert and cooperative. Normal mood and affect.  Intake/Output from previous day:   Intake/Output this shift: Total I/O In: 750 [I.V.:750] Out: 1075 [Urine:1075]  Lab Results:  Recent Labs  11/04/12 0327 11/04/12 0711 11/04/12 1242  WBC 13.2* 10.1 9.2  HGB 12.7 9.6* 8.8*  HCT 37.1 28.3* 26.9*  PLT 226 171 160   BMET  Recent Labs  11/04/12 0322 11/04/12 0327  NA 143 142  K 3.6 4.1  CL 107 104  CO2  --  28  GLUCOSE 139* 137*  BUN 38* 36*  CREATININE 0.60 0.57  CALCIUM  --  9.2        Studies/Results: Dg Chest Portable 1 View  11/04/2012  *RADIOLOGY REPORT*  Clinical Data: Patient vomiting blood after endoscopy.  PORTABLE CHEST - 1 VIEW  Comparison: None.  Findings: Lungs clear.  Heart size normal.  No pneumothorax or pleural effusion.  No free intraperitoneal air is identified.  No focal bony abnormality.  IMPRESSION: Negative exam.   Original Report Authenticated By: Holley Dexter, M.D.     IMPRESSION:  #89 52 year old female with acute upper GI bleed secondary to esophageal tear at the dilation site status post EGD with dilation 11/03/2012 #2 anemia  acute secondary to GI blood loss #3 syncope-secondary to orthostatic hypotension with acute blood loss #4 chronic GERD #5 IBS and history of colon polyps  PLAN: #1 patient is stable for discharge home today, he is advised to continue a very soft diet through the weekend and to limit her activity. #2 continue Nexium 40 mg by mouth daily #3 patient was given a prescription for Carafate yesterday 1 g 4 times daily-have asked her to make this into a suspension//dissolve in 5 cc of water and use as a slurry 4 times daily between meals and at bedtime #4 instructed to avoid all aspirin and anti-inflammatories over the next 2 weeks #5 return to the emergency room for any evidence of recurrent bleeding #6 Will check CBC in the office next week have asked her to come by the lab on Tuesday.   Amy Esterwood  11/04/2012, 2:19 PM   GI ATTENDING  Patient seen and examined. Laboratories and x-rays reviewed. Agree with history and physical as outlined above. Patient likely had contained tear associated with dilation. Subsequent hematemesis. Has been stable since hospitalization with no further vomiting. She never had pain. Has not had a bowel movement. Exam is benign. We will advance diet. If she does well, may go home later today.  Wilhemina Bonito. Eda Keys., M.D. [facility] Division of Gastroenterology

## 2012-11-04 NOTE — Progress Notes (Signed)
  Echocardiogram 2D Echocardiogram has been performed.  Monica Brooks 11/04/2012, 8:21 AM

## 2012-11-04 NOTE — ED Notes (Signed)
Dr. Adela Glimpse made away of dizziness. Ok to give 3rd liter if pt continues to be dizzy

## 2012-11-05 LAB — COMPREHENSIVE METABOLIC PANEL
Alkaline Phosphatase: 54 U/L (ref 39–117)
BUN: 16 mg/dL (ref 6–23)
Chloride: 109 mEq/L (ref 96–112)
GFR calc Af Amer: >90 mL/min (ref >90)
GFR calc non Af Amer: >90 mL/min (ref >90)
Glucose, Bld: 108 mg/dL — ABNORMAL HIGH (ref 70–99)
Potassium: 3.7 mEq/L (ref 3.5–5.1)
Total Bilirubin: 0.3 mg/dL (ref 0.3–1.2)

## 2012-11-05 LAB — CBC
MCH: 30.3 pg (ref 26.0–34.0)
MCHC: 33.9 g/dL (ref 30.0–36.0)
Platelets: 148 10*3/uL — ABNORMAL LOW (ref 150–400)
RDW: 13.9 % (ref 11.5–15.5)

## 2012-11-05 LAB — HEMOGLOBIN A1C: Hgb A1c MFr Bld: 5.9 % — ABNORMAL HIGH (ref <5.7)

## 2012-11-05 NOTE — Progress Notes (Signed)
Appetite good. Pt happy to be going home. Discharge instructions explained to pt. IV dc'd. Husband in to take pt home.

## 2012-11-05 NOTE — Discharge Summary (Addendum)
Physician Discharge Summary  [name]:[id number] DOB: 12/19/1960 DOA: 11/04/2012  PCP: Damien Fusi, MD  Admit date: 11/04/2012 Discharge date: 11/05/2012  Time spent: 45 minutes  Recommendations for Outpatient Follow-up:  1. Please follow up with PCP in 2-3 days 2. Please follow up with GI in 2 days for repeat CBC   Recommendations for primary care physician for things to follow:  Follow up CBC closely.   Recommendations from GI consultant #1 patient is stable for discharge home today (5/10), he is advised to continue a very soft diet through the weekend and to limit her activity.  #2 continue Nexium 40 mg by mouth daily  #3 patient was given a prescription for Carafate yesterday 1 g 4 times daily-have asked her to make this into a suspension//dissolve in 5 cc of water and use as a slurry 4 times daily between meals and at bedtime  #4 instructed to avoid all aspirin and anti-inflammatories over the next 2 weeks  #5 return to the emergency room for any evidence of recurrent bleeding  #6 Will check CBC in the office next week have asked her to come by the lab on Tuesday.  Discharge Diagnoses:  Active Problems:   Esophageal reflux   Hematemesis   Syncope   Stricture and stenosis of esophagus  Discharge Condition: stable  Diet recommendation: regular  Filed Weights   11/04/12 0636  Weight: 75.433 kg (166 lb 4.8 oz)   History of present illness:  Monica Brooks is a 52 y.o. female has a past medical history of Stricture of esophagus (1992); Esophagitis (1992); Sliding hiatal hernia (1992); Arthritis; Chronic headaches; Colon polyps; GERD (gastroesophageal reflux disease); HLD (hyperlipidemia); IBS (irritable bowel syndrome); Allergy; Anemia; Anxiety; HTN (hypertension); and Ulcer. Presented with 5/9 she had an EGD done with dilatation of stricture and biopsy done by Dr. Jarold Motto with [facility]. Few hours later she had an episode of hematemesis productive of bright red blood.  Patient states she has vomitted about 3 cups of blood. Soon there after she became light-headed and syncopized. She eventually was able to get to the phone and called for help on arrival to ER she was still noted to be tachycardic but is feeling better. ER MD has discussed her case with [facility] on call and at his point she is being admitted by hospitalist for further monitoring.   Hospital Course:  . Hematemesis - no more episodes after admission, patient remained stable without clinical evidence of further bleeding as patient was no longer lightheaded, she had no tachycardia and was normotensive. Hemoglobin trended down as expected after initial hematemesis episodes at home and remained stable. There was likely a dilutional component as well due to IVF initial resuscitation. Gastroenterology has been consulted and felt like Mrs. Ostenson had a contained tear associated with dilation with hematemesis. She has close follow up in 2 days at the GI clinic for repeat CBC.  Marland Kitchen Esophageal reflux - Protonix and Carafate  . Syncope - syncope workup most likely secondary to vasovagal reaction, troponin negative. 2D echo done while hospitalized, read below.   Procedures:  2D ehco - Left ventricle: The cavity size was normal. Wall thickness was increased in a pattern of mild LVH. Systolic function was normal. The estimated ejection fraction was in the range of 60% to 65%. Wall motion was normal; there were no regional wall motion abnormalities. Left ventricular diastolic function parameters were normal. Atrial septum: No defect or patent foramen ovale was identified. Pulmonary arteries: PA peak  pressure: 32mm Hg (S).  Consultations:  Gastroenterology - [place], Dr. Marina Goodell  Discharge Exam: Filed Vitals:   11/04/12 1800 11/04/12 1900 11/04/12 2100 11/05/12 0500  BP: 122/73  101/67 99/68  Pulse: 84 85 80 78  Temp:   98.7 F (37.1 C) 98.5 F (36.9 C)  TempSrc:   Oral Oral  Resp: 23 12 16 16   Height:      Weight:       SpO2: 100% 100% 100% 100%   General: NAD Cardiovascular: RRR Respiratory: CTA biL  Discharge Instructions    Medication List    STOP taking these medications       celecoxib 200 MG capsule  Commonly known as:  CELEBREX      TAKE these medications       amitriptyline 50 MG tablet  Commonly known as:  ELAVIL  Take 50 mg by mouth at bedtime.     esomeprazole 40 MG capsule  Commonly known as:  NEXIUM  Take 1 capsule (40 mg total) by mouth 2 (two) times daily.     oxyCODONE-acetaminophen 5-325 MG per tablet  Commonly known as:  PERCOCET/ROXICET  Take 1 tablet by mouth 2 (two) times daily as needed for pain.     rosuvastatin 40 MG tablet  Commonly known as:  CRESTOR  Take 40 mg by mouth daily.     sucralfate 1 G tablet  Commonly known as:  CARAFATE  Take 1 tablet (1 g total) by mouth at bedtime.     Vitamin D (Ergocalciferol) 50000 UNITS Caps  Commonly known as:  DRISDOL  Take 50,000 Units by mouth every 7 (seven) days. tuesday           Follow-up Information   Please follow up.   Contact information:   come to [facility] lab on tuesday 5/13 for a CBC- we will call you with results      The results of significant diagnostics from this hospitalization (including imaging, microbiology, ancillary and laboratory) are listed below for reference.    Significant Diagnostic Studies: Dg Chest Portable 1 View  11/04/2012  *RADIOLOGY REPORT*  Clinical Data: Patient vomiting blood after endoscopy.  PORTABLE CHEST - 1 VIEW  Comparison: None.  Findings: Lungs clear.  Heart size normal.  No pneumothorax or pleural effusion.  No free intraperitoneal air is identified.  No focal bony abnormality.  IMPRESSION: Negative exam.   Original Report Authenticated By: Holley Dexter, M.D.    Microbiology: Recent Results (from the past 240 hour(s))  MRSA PCR SCREENING     Status: None   Collection Time    11/04/12  6:35 AM      Result Value Range Status   MRSA by PCR  NEGATIVE  NEGATIVE Final   Comment:            The GeneXpert MRSA Assay (FDA     approved for NASAL specimens     only), is one component of a     comprehensive MRSA colonization     surveillance program. It is not     intended to diagnose MRSA     infection nor to guide or     monitor treatment for     MRSA infections.     Labs: Basic Metabolic Panel:  Recent Labs Lab 11/04/12 0322 11/04/12 0327 11/05/12 0212  NA 143 142 140  K 3.6 4.1 3.7  CL 107 104 109  CO2  --  28 26  GLUCOSE 139* 137* 108*  BUN  38* 36* 16  CREATININE 0.60 0.57 0.62  CALCIUM  --  9.2 8.7  MG  --   --  1.8  PHOS  --   --  2.6   Liver Function Tests:  Recent Labs Lab 11/05/12 0212  AST 12  ALT 11  ALKPHOS 54  BILITOT 0.3  PROT 5.1*  ALBUMIN 3.0*   CBC:  Recent Labs Lab 11/04/12 0327 11/04/12 0711 11/04/12 1242 11/04/12 1908 11/05/12 0212  WBC 13.2* 10.1 9.2 10.7* 9.2  NEUTROABS 9.7*  --   --   --   --   HGB 12.7 9.6* 8.8* 8.8* 8.3*  HCT 37.1 28.3* 26.9* 26.4* 24.5*  MCV 89.4 89.0 88.5 89.2 89.4  PLT 226 171 160 175 148*   Cardiac Enzymes:  Recent Labs Lab 11/04/12 0711 11/04/12 1242 11/04/12 1908  TROPONINI <0.30 <0.30 <0.30    Signed:  Pamella Pert  [facility] 11/05/2012, 9:54 AM

## 2012-11-05 NOTE — Plan of Care (Signed)
Problem: Discharge Progression Outcomes Goal: Stools guaiac negative Outcome: Adequate for Discharge No stools today

## 2012-11-06 ENCOUNTER — Other Ambulatory Visit: Payer: Self-pay | Admitting: *Deleted

## 2012-11-06 ENCOUNTER — Encounter: Payer: Federal, State, Local not specified - PPO | Admitting: Gastroenterology

## 2012-11-06 ENCOUNTER — Telehealth: Payer: Self-pay | Admitting: *Deleted

## 2012-11-06 DIAGNOSIS — K922 Gastrointestinal hemorrhage, unspecified: Secondary | ICD-10-CM

## 2012-11-06 DIAGNOSIS — Z9889 Other specified postprocedural states: Secondary | ICD-10-CM

## 2012-11-06 LAB — HELICOBACTER PYLORI SCREEN-BIOPSY: UREASE: NEGATIVE

## 2012-11-06 LAB — OCCULT BLOOD, POC DEVICE: Fecal Occult Bld: NEGATIVE

## 2012-11-06 NOTE — Telephone Encounter (Signed)
No identifier, left message, follow-up  

## 2012-11-06 NOTE — Progress Notes (Signed)
Mike Gip, PA left me a message requesting an order for CBC for 11/08/12; pt with post dilation bleed. Ordered.

## 2012-11-15 ENCOUNTER — Encounter: Payer: Self-pay | Admitting: Gastroenterology

## 2012-11-16 ENCOUNTER — Telehealth: Payer: Self-pay | Admitting: *Deleted

## 2012-11-16 NOTE — Telephone Encounter (Signed)
Could not find the lab result from 11/07/12 post EGD Dilation bleed. Spoke with pt and she went to [facility]. Received the fax from [facility] and I will forward it to [facility], [place] and then it will be scanned.

## 2013-02-27 ENCOUNTER — Other Ambulatory Visit: Payer: Self-pay | Admitting: Gastroenterology

## 2013-04-01 ENCOUNTER — Other Ambulatory Visit: Payer: Self-pay | Admitting: Gastroenterology

## 2013-09-03 ENCOUNTER — Other Ambulatory Visit: Payer: Self-pay | Admitting: Gastroenterology

## 2014-02-09 ENCOUNTER — Emergency Department (HOSPITAL_COMMUNITY)
Admission: EM | Admit: 2014-02-09 | Discharge: 2014-02-10 | Disposition: A | Discharge status: Home / Self Care | Payer: Federal, State, Local not specified - PPO | Attending: Emergency Medicine | Admitting: Emergency Medicine

## 2014-02-09 ENCOUNTER — Encounter (HOSPITAL_COMMUNITY): Payer: Self-pay | Admitting: Emergency Medicine

## 2014-02-09 ENCOUNTER — Emergency Department (HOSPITAL_COMMUNITY): Payer: Federal, State, Local not specified - PPO

## 2014-02-09 DIAGNOSIS — Z860100 Personal history of colon polyps, unspecified: Secondary | ICD-10-CM | POA: Insufficient documentation

## 2014-02-09 DIAGNOSIS — F411 Generalized anxiety disorder: Secondary | ICD-10-CM | POA: Diagnosis not present

## 2014-02-09 DIAGNOSIS — G8929 Other chronic pain: Secondary | ICD-10-CM | POA: Diagnosis not present

## 2014-02-09 DIAGNOSIS — Z862 Personal history of diseases of the blood and blood-forming organs and certain disorders involving the immune mechanism: Secondary | ICD-10-CM | POA: Diagnosis not present

## 2014-02-09 DIAGNOSIS — Z880 Allergy status to penicillin: Secondary | ICD-10-CM | POA: Diagnosis not present

## 2014-02-09 DIAGNOSIS — K219 Gastro-esophageal reflux disease without esophagitis: Secondary | ICD-10-CM | POA: Diagnosis not present

## 2014-02-09 DIAGNOSIS — R079 Chest pain, unspecified: Secondary | ICD-10-CM | POA: Diagnosis not present

## 2014-02-09 DIAGNOSIS — Z872 Personal history of diseases of the skin and subcutaneous tissue: Secondary | ICD-10-CM | POA: Insufficient documentation

## 2014-02-09 DIAGNOSIS — F17200 Nicotine dependence, unspecified, uncomplicated: Secondary | ICD-10-CM | POA: Insufficient documentation

## 2014-02-09 DIAGNOSIS — M129 Arthropathy, unspecified: Secondary | ICD-10-CM | POA: Insufficient documentation

## 2014-02-09 DIAGNOSIS — K589 Irritable bowel syndrome without diarrhea: Secondary | ICD-10-CM | POA: Diagnosis not present

## 2014-02-09 DIAGNOSIS — Z791 Long term (current) use of non-steroidal anti-inflammatories (NSAID): Secondary | ICD-10-CM | POA: Diagnosis not present

## 2014-02-09 DIAGNOSIS — E785 Hyperlipidemia, unspecified: Secondary | ICD-10-CM | POA: Insufficient documentation

## 2014-02-09 DIAGNOSIS — I10 Essential (primary) hypertension: Secondary | ICD-10-CM | POA: Diagnosis not present

## 2014-02-09 LAB — COMPREHENSIVE METABOLIC PANEL
ALK PHOS: 105 U/L (ref 39–117)
ALT: 23 U/L (ref 0–35)
ANION GAP: 15 (ref 5–15)
AST: 19 U/L (ref 0–37)
Albumin: 4.4 g/dL (ref 3.5–5.2)
BILIRUBIN TOTAL: 0.3 mg/dL (ref 0.3–1.2)
BUN: 14 mg/dL (ref 6–23)
CHLORIDE: 101 meq/L (ref 96–112)
CO2: 25 mEq/L (ref 19–32)
Calcium: 9.9 mg/dL (ref 8.4–10.5)
Creatinine, Ser: 0.65 mg/dL (ref 0.50–1.10)
GFR calc non Af Amer: >90 mL/min (ref >90)
GLUCOSE: 156 mg/dL — AB (ref 70–99)
POTASSIUM: 3.4 meq/L — AB (ref 3.7–5.3)
SODIUM: 141 meq/L (ref 137–147)
Total Protein: 7.6 g/dL (ref 6.0–8.3)

## 2014-02-09 LAB — CBC
HEMATOCRIT: 43.9 % (ref 36.0–46.0)
Hemoglobin: 14.9 g/dL (ref 12.0–15.0)
MCH: 30.5 pg (ref 26.0–34.0)
MCHC: 33.9 g/dL (ref 30.0–36.0)
MCV: 89.8 fL (ref 78.0–100.0)
PLATELETS: 231 10*3/uL (ref 150–400)
RBC: 4.89 MIL/uL (ref 3.87–5.11)
RDW: 14 % (ref 11.5–15.5)
WBC: 11.4 10*3/uL — AB (ref 4.0–10.5)

## 2014-02-09 LAB — D-DIMER, QUANTITATIVE (NOT AT ARMC)

## 2014-02-09 LAB — I-STAT TROPONIN, ED: Troponin i, poc: 0 ng/mL (ref 0.00–0.08)

## 2014-02-09 MED ORDER — NITROGLYCERIN 0.4 MG SL SUBL: 0.4000 mg | SUBLINGUAL_TABLET | SUBLINGUAL | Status: DC | PRN

## 2014-02-09 NOTE — ED Provider Notes (Signed)
CSN: [id number]     Arrival date & time 02/09/14  2016 History   First MD Initiated Contact with Patient 02/09/14 2129     Chief Complaint  Patient presents with  . Chest Pain  . Hypertension     (Consider location/radiation/quality/duration/timing/severity/associated sxs/prior Treatment) Patient is a 53 y.o. female presenting with chest pain.  Chest Pain Pain location:  Substernal area Pain quality: pressure   Pain radiates to:  Does not radiate Pain severity:  Moderate Onset quality:  Gradual Duration:  8 hours Timing:  Constant Progression:  Improving Chronicity:  New Context: not breathing   Relieved by:  Nothing Worsened by:  Nothing tried Ineffective treatments:  None tried Associated symptoms: no abdominal pain, no back pain, no cough, no dizziness, no dysphagia, no fever, no heartburn, no nausea, no numbness, no shortness of breath and not vomiting     Past Medical History  Diagnosis Date  . Stricture of esophagus 1992    EGD---Dr. Velora Heckler   . Esophagitis 1992    EGD---Dr. Velora Heckler   . Sliding hiatal hernia 1992    EGD---Dr. Velora Heckler   . Arthritis   . Chronic headaches   . Colon polyps   . GERD (gastroesophageal reflux disease)   . HLD (hyperlipidemia)   . IBS (irritable bowel syndrome)   . Allergy   . Anemia   . Anxiety   . HTN (hypertension)     no meds  . Ulcer    Past Surgical History  Procedure Laterality Date  . Knee arthroscopy Left   . Ankle fracture surgery Left   . Bunionectomy Left     with tendon release  . Abdominal hysterectomy    . Breast cyst excision Right    Family History  Problem Relation Age of Onset  . Breast cancer Mother   . Prostate cancer Father   . Colon polyps Father   . Colon cancer Maternal Grandmother   . Diabetes Maternal Grandmother   . Diabetes Sister   . Diabetes Maternal Aunt   . Esophageal cancer Neg Hx   . Stomach cancer Neg Hx   . Rectal cancer Neg Hx    History  Substance Use Topics  . Smoking  status: Current Every Day Smoker -- 0.50 packs/day for 25 years    Types: Cigarettes  . Smokeless tobacco: Never Used  . Alcohol Use: No   OB History   Grav Para Term Preterm Abortions TAB SAB Ect Mult Living                 Review of Systems  Constitutional: Negative for fever and chills.  HENT: Negative for congestion, rhinorrhea, sore throat and trouble swallowing.   Eyes: Negative for photophobia and visual disturbance.  Respiratory: Negative for cough and shortness of breath.   Cardiovascular: Positive for chest pain. Negative for leg swelling.  Gastrointestinal: Negative for heartburn, nausea, vomiting, abdominal pain, diarrhea and constipation.  Endocrine: Negative for polyphagia and polyuria.  Genitourinary: Negative for dysuria, flank pain, vaginal bleeding, vaginal discharge and enuresis.  Musculoskeletal: Negative for back pain and gait problem.  Skin: Negative for color change and rash.  Neurological: Negative for dizziness, syncope, light-headedness and numbness.  Hematological: Negative for adenopathy. Does not bruise/bleed easily.  All other systems reviewed and are negative.     Allergies  Aspirin; Amoxicillin-pot clavulanate; and Sulfa antibiotics  Home Medications   Prior to Admission medications   Medication Sig Start Date End Date Taking? Authorizing Provider  amitriptyline (ELAVIL)  50 MG tablet Take 50 mg by mouth at bedtime.   Yes Historical Provider, MD  celecoxib (CELEBREX) 200 MG capsule Take 200 mg by mouth every morning.    Yes Historical Provider, MD  cyclobenzaprine (FLEXERIL) 10 MG tablet Take 10 mg by mouth 3 (three) times daily as needed.  10/24/13  Yes Historical Provider, MD  esomeprazole (NEXIUM) 40 MG capsule Take 40 mg by mouth 2 (two) times daily before a meal.   Yes Historical Provider, MD  ibuprofen (ADVIL,MOTRIN) 800 MG tablet Take 800 mg by mouth every 8 (eight) hours as needed for headache or mild pain.   Yes Historical Provider, MD   lidocaine (LIDODERM) 5 % Place 1 patch onto the skin daily as needed (for pain relief).    Yes Historical Provider, MD  methocarbamol (ROBAXIN) 750 MG tablet Take 750 mg by mouth every 6 (six) hours as needed for muscle spasms.   Yes Historical Provider, MD  oxyCODONE-acetaminophen (PERCOCET/ROXICET) 5-325 MG per tablet Take 1 tablet by mouth every 6 (six) hours as needed for severe pain.   Yes Historical Provider, MD  PRESCRIPTION MEDICATION Injection unknown for pain management , injected in SI joint. By Gwyndolyn Saxon [facility] pain [facility] doctor in [place]. First time injection was received.   Yes Historical Provider, MD  rosuvastatin (CRESTOR) 40 MG tablet Take 40 mg by mouth at bedtime.    Yes Historical Provider, MD  Vitamin D, Ergocalciferol, (DRISDOL) 50000 UNITS CAPS Take 50,000 Units by mouth every 7 (seven) days. Fridays   Yes Historical Provider, MD   BP 138/80  Pulse 65  Temp(Src) 98.1 F (36.7 C) (Oral)  Resp 16  Wt 174 lb (78.926 kg)  SpO2 96% Physical Exam  Vitals reviewed. Constitutional: She is oriented to person, place, and time. She appears well-developed and well-nourished.  HENT:  Head: Normocephalic and atraumatic.  Right Ear: External ear normal.  Left Ear: External ear normal.  Eyes: Conjunctivae and EOM are normal. Pupils are equal, round, and reactive to light.  Neck: Normal range of motion. Neck supple.  Cardiovascular: Normal rate, regular rhythm, normal heart sounds and intact distal pulses.   Pulmonary/Chest: Effort normal and breath sounds normal.  Abdominal: Soft. Bowel sounds are normal. There is no tenderness.  Musculoskeletal: Normal range of motion.  Neurological: She is alert and oriented to person, place, and time.  Skin: Skin is warm and dry.    ED Course  Procedures (including critical care time) Labs Review Labs Reviewed  COMPREHENSIVE METABOLIC PANEL - Abnormal; Notable for the following:    Potassium 3.4 (*)    Glucose, Bld 156 (*)     All other components within normal limits  CBC - Abnormal; Notable for the following:    WBC 11.4 (*)    All other components within normal limits  D-DIMER, QUANTITATIVE  I-STAT TROPOININ, ED  Randolm Idol, ED    Imaging Review Dg Chest 2 View  02/09/2014   CLINICAL DATA:  Smoker.  Chest pain.  EXAM: CHEST  2 VIEW  COMPARISON:  11/04/2012  FINDINGS: The heart size and mediastinal contours are within normal limits. Both lungs are clear. The visualized skeletal structures are unremarkable.  IMPRESSION: No active cardiopulmonary disease.   Electronically Signed   By: Rolm Baptise M.D.   On: 02/09/2014 20:57     EKG Interpretation   Date/Time:  Saturday February 09 2014 20:29:49 EDT Ventricular Rate:  72 PR Interval:  142 QRS Duration: 105 QT Interval:  408 QTC Calculation:  446 R Axis:   52 Text Interpretation:  Sinus rhythm Minimal ST elevation, anterior leads No  old tracing to compare Confirmed by Debby Freiberg [phone]) on 02/09/2014  8:34:23 PM      MDM   Final diagnoses:  Chest pain, unspecified chest pain type    53 y.o. female  with pertinent PMH of esophageal stricture and hernia presents with chest pain as described above. Pain atypical in nature, nonexertional, however was associated with hypertension to 242 systolic.  Pain initially began in her back between her shoulder blades, for which the patient took her home Robaxin and Percocet which she has for chronic back pain. Unfortunately shortly thereafter she began to have left-sided chest pain which described a 6/10, and dull aching. She has no history of similar symptoms.      Labs and imaging as above reviewed. Initial troponin and d-dimer unremarkable. Chest x-ray and EKG similarly unremarkable. Patient given GI cocktail with total relief of symptoms.  Given constellation of history and relief, consider likely GERD vs esophagitis as etiology of symptoms  1. Chest pain, unspecified chest pain type          Debby Freiberg, MD 02/10/14 1155

## 2014-02-09 NOTE — ED Notes (Signed)
Per pt she began having chest pain at 1300 today.  Pain began in her back and moved into her left chest.  Pt rates pain 6/10.  Pt took percocet 5/325 and robaxin 750 mg at 1830 with mild relief.

## 2014-02-10 LAB — I-STAT TROPONIN, ED: Troponin i, poc: 0 ng/mL (ref 0.00–0.08)

## 2014-02-10 MED ORDER — GI COCKTAIL ~~LOC~~
30.0000 mL | Freq: Once | ORAL | Status: AC
  Administered 2014-02-10: 30 mL via ORAL
  Filled 2014-02-10: qty 30

## 2014-02-10 MED ORDER — ALUM & MAG HYDROXIDE-SIMETH 200-200-20 MG/5ML PO SUSP
15.0000 mL | Freq: Once | ORAL | Status: DC
  Filled 2014-02-10: qty 30

## 2014-02-10 MED ORDER — HYDROMORPHONE HCL PF 1 MG/ML IJ SOLN
1.0000 mg | Freq: Once | INTRAMUSCULAR | Status: AC
  Administered 2014-02-10: 1 mg via INTRAVENOUS
  Filled 2014-02-10: qty 1

## 2014-02-10 MED ORDER — LIDOCAINE VISCOUS 2 % MT SOLN
15.0000 mL | Freq: Once | OROMUCOSAL | Status: DC
  Filled 2014-02-10: qty 15

## 2014-02-10 NOTE — Discharge Instructions (Signed)

## 2014-02-10 NOTE — ED Notes (Signed)
Patient is alert and oriented x3.  She was given DC instructions and follow up visit instructions.  Patient gave verbal understanding. She was DC ambulatory under her own power to home.  V/S stable.  He was not showing any signs of distress on DC 

## 2015-10-08 ENCOUNTER — Ambulatory Visit (INDEPENDENT_AMBULATORY_CARE_PROVIDER_SITE_OTHER): Payer: Federal, State, Local not specified - PPO | Admitting: Physician Assistant

## 2015-10-08 ENCOUNTER — Ambulatory Visit (INDEPENDENT_AMBULATORY_CARE_PROVIDER_SITE_OTHER): Payer: Federal, State, Local not specified - PPO

## 2015-10-08 ENCOUNTER — Encounter: Payer: Self-pay | Admitting: Physician Assistant

## 2015-10-08 VITALS — BP 152/83 | HR 79 | Ht 69.5 in | Wt 166.0 lb

## 2015-10-08 DIAGNOSIS — M5126 Other intervertebral disc displacement, lumbar region: Secondary | ICD-10-CM | POA: Insufficient documentation

## 2015-10-08 DIAGNOSIS — M150 Primary generalized (osteo)arthritis: Secondary | ICD-10-CM | POA: Diagnosis not present

## 2015-10-08 DIAGNOSIS — M8949 Other hypertrophic osteoarthropathy, multiple sites: Secondary | ICD-10-CM

## 2015-10-08 DIAGNOSIS — R0602 Shortness of breath: Secondary | ICD-10-CM

## 2015-10-08 DIAGNOSIS — R05 Cough: Secondary | ICD-10-CM

## 2015-10-08 DIAGNOSIS — E785 Hyperlipidemia, unspecified: Secondary | ICD-10-CM

## 2015-10-08 DIAGNOSIS — Z1231 Encounter for screening mammogram for malignant neoplasm of breast: Secondary | ICD-10-CM

## 2015-10-08 DIAGNOSIS — K2100 Gastro-esophageal reflux disease with esophagitis, without bleeding: Secondary | ICD-10-CM

## 2015-10-08 DIAGNOSIS — M159 Polyosteoarthritis, unspecified: Secondary | ICD-10-CM

## 2015-10-08 DIAGNOSIS — G43001 Migraine without aura, not intractable, with status migrainosus: Secondary | ICD-10-CM | POA: Insufficient documentation

## 2015-10-08 MED ORDER — ESOMEPRAZOLE MAGNESIUM 40 MG PO CPDR: 40.0000 mg | DELAYED_RELEASE_CAPSULE | Freq: Two times a day (BID) | ORAL | Status: DC

## 2015-10-08 MED ORDER — AMITRIPTYLINE HCL 50 MG PO TABS: 50.0000 mg | ORAL_TABLET | Freq: Every day | ORAL | Status: DC

## 2015-10-08 MED ORDER — FEXOFENADINE HCL 180 MG PO TABS: 180.0000 mg | ORAL_TABLET | Freq: Every day | ORAL | Status: DC

## 2015-10-08 MED ORDER — PROMETHAZINE HCL 25 MG PO TABS: 25.0000 mg | ORAL_TABLET | ORAL | Status: DC | PRN

## 2015-10-08 MED ORDER — CELECOXIB 200 MG PO CAPS: 200.0000 mg | ORAL_CAPSULE | Freq: Two times a day (BID) | ORAL | Status: DC

## 2015-10-08 MED ORDER — ROSUVASTATIN CALCIUM 40 MG PO TABS: 40.0000 mg | ORAL_TABLET | Freq: Every day | ORAL | Status: DC

## 2015-10-08 NOTE — Progress Notes (Signed)
Subjective:    Patient ID: Monica Brooks, female    DOB: 13-Oct-1960, 55 y.o.   MRN: [id number]  HPI Pt is a 55 yo female who presents to the clinic to establish care.  Her previous PCP [place] state.   .. Active Ambulatory Problems    Diagnosis Date Noted  . Hyperlipidemia 09/15/2007  . Esophageal reflux 09/15/2007  . IRRITABLE BOWEL SYNDROME, HX OF 09/15/2007  . Hematemesis 11/04/2012  . Syncope 11/04/2012  . Stricture and stenosis of esophagus 11/04/2012  . Lumbar disc herniation 10/08/2015  . Migraine without aura and with status migrainosus, not intractable 10/08/2015  . Primary osteoarthritis involving multiple joints 10/11/2015   Resolved Ambulatory Problems    Diagnosis Date Noted  . No Resolved Ambulatory Problems   Past Medical History  Diagnosis Date  . Stricture of esophagus 1992  . Esophagitis 1992  . Sliding hiatal hernia 1992  . Arthritis   . Chronic headaches   . Colon polyps   . GERD (gastroesophageal reflux disease)   . HLD (hyperlipidemia)   . IBS (irritable bowel syndrome)   . Allergy   . Anemia   . Anxiety   . HTN (hypertension)   . Ulcer    .Marland Kitchen Family History  Problem Relation Age of Onset  . Breast cancer Mother   . Hypertension Mother   . Prostate cancer Father   . Colon polyps Father   . Hypertension Father   . Stroke Father   . Colon cancer Maternal Grandmother   . Diabetes Maternal Grandmother   . Diabetes Sister   . Hyperlipidemia Sister   . Hypertension Sister   . Diabetes Maternal Aunt   . Esophageal cancer Neg Hx   . Stomach cancer Neg Hx   . Rectal cancer Neg Hx    .Marland Kitchen Social History   Social History  . Marital Status: Single    Spouse Name: N/A  . Number of Children: 2  . Years of Education: N/A   Occupational History  . management    Social History Main Topics  . Smoking status: Current Every Day Smoker -- 0.50 packs/day for 25 years    Types: Cigarettes  . Smokeless tobacco: Never Used  . Alcohol  Use: No  . Drug Use: No  . Sexual Activity: Yes   Other Topics Concern  . Not on file   Social History Narrative   Pt has no new problems. She feels controlled just needs refills.   She does smoke daily. She has been getting CXR yearly. She has some ongoing SOB but nothing she needs inhalers for. She would like CXR.Marland Kitchen   Review of Systems  All other systems reviewed and are negative.      Objective:   Physical Exam  Constitutional: She is oriented to person, place, and time. She appears well-developed and well-nourished.  HENT:  Head: Normocephalic and atraumatic.  Cardiovascular: Normal rate, regular rhythm and normal heart sounds.   Pulmonary/Chest: Effort normal and breath sounds normal.  Neurological: She is alert and oriented to person, place, and time.  Psychiatric: She has a normal mood and affect. Her behavior is normal.          Assessment & Plan:  Hyperlipidemia- refilled crestor. Needs CPE with labs.   Tobacco dependence/SOB- CXR ordered for increase in SOB. Discussed cessation but she is not ready to quit.   Migraines- controlled with elavil at bedtime.   GERD- refilled nexium.   Lumbar herniated disc/multple joint  pain- refilled celebrex.   Mammogram ordered. Needs labs and CPE.

## 2015-10-11 DIAGNOSIS — M150 Primary generalized (osteo)arthritis: Secondary | ICD-10-CM

## 2015-10-11 DIAGNOSIS — M8949 Other hypertrophic osteoarthropathy, multiple sites: Secondary | ICD-10-CM | POA: Insufficient documentation

## 2015-10-11 DIAGNOSIS — M159 Polyosteoarthritis, unspecified: Secondary | ICD-10-CM | POA: Insufficient documentation

## 2015-10-14 ENCOUNTER — Telehealth: Payer: Self-pay | Admitting: *Deleted

## 2015-10-14 NOTE — Telephone Encounter (Signed)
Called patient to ask her what medications she has tried and failed for GERD. ( received PA request for genereic nexium and this may be denied since you can get it otc)

## 2015-10-15 ENCOUNTER — Ambulatory Visit (INDEPENDENT_AMBULATORY_CARE_PROVIDER_SITE_OTHER): Payer: Federal, State, Local not specified - PPO

## 2015-10-15 DIAGNOSIS — Z1231 Encounter for screening mammogram for malignant neoplasm of breast: Secondary | ICD-10-CM | POA: Diagnosis not present

## 2015-10-21 NOTE — Telephone Encounter (Signed)
Pt called back and states she has tried tagament ,pepcid and prevacid and zantac all otc

## 2015-10-23 NOTE — Telephone Encounter (Signed)
Your request has been approved  PA has been Approved  Pt notified via vm  Pharm notified via vm

## 2015-12-22 ENCOUNTER — Other Ambulatory Visit: Payer: Self-pay | Admitting: Psychiatry

## 2015-12-22 DIAGNOSIS — M5416 Radiculopathy, lumbar region: Secondary | ICD-10-CM

## 2015-12-29 ENCOUNTER — Ambulatory Visit (INDEPENDENT_AMBULATORY_CARE_PROVIDER_SITE_OTHER): Payer: Federal, State, Local not specified - PPO

## 2015-12-29 DIAGNOSIS — M5126 Other intervertebral disc displacement, lumbar region: Secondary | ICD-10-CM | POA: Diagnosis not present

## 2015-12-29 DIAGNOSIS — M5127 Other intervertebral disc displacement, lumbosacral region: Secondary | ICD-10-CM | POA: Diagnosis not present

## 2015-12-29 DIAGNOSIS — M5416 Radiculopathy, lumbar region: Secondary | ICD-10-CM

## 2015-12-29 DIAGNOSIS — M4807 Spinal stenosis, lumbosacral region: Secondary | ICD-10-CM

## 2016-01-01 ENCOUNTER — Other Ambulatory Visit: Payer: Self-pay | Admitting: Physician Assistant

## 2016-01-27 ENCOUNTER — Encounter: Payer: Self-pay | Admitting: Student

## 2016-01-27 DIAGNOSIS — M5416 Radiculopathy, lumbar region: Secondary | ICD-10-CM | POA: Insufficient documentation

## 2016-03-28 ENCOUNTER — Other Ambulatory Visit: Payer: Self-pay | Admitting: Physician Assistant

## 2016-03-30 ENCOUNTER — Other Ambulatory Visit: Payer: Self-pay | Admitting: Physician Assistant

## 2016-03-30 DIAGNOSIS — M5116 Intervertebral disc disorders with radiculopathy, lumbar region: Secondary | ICD-10-CM | POA: Diagnosis not present

## 2016-03-30 DIAGNOSIS — M5416 Radiculopathy, lumbar region: Secondary | ICD-10-CM | POA: Diagnosis not present

## 2016-03-30 DIAGNOSIS — R262 Difficulty in walking, not elsewhere classified: Secondary | ICD-10-CM | POA: Diagnosis not present

## 2016-04-01 DIAGNOSIS — M5416 Radiculopathy, lumbar region: Secondary | ICD-10-CM | POA: Diagnosis not present

## 2016-04-01 DIAGNOSIS — R262 Difficulty in walking, not elsewhere classified: Secondary | ICD-10-CM | POA: Diagnosis not present

## 2016-04-01 DIAGNOSIS — M5116 Intervertebral disc disorders with radiculopathy, lumbar region: Secondary | ICD-10-CM | POA: Diagnosis not present

## 2016-04-06 DIAGNOSIS — M5116 Intervertebral disc disorders with radiculopathy, lumbar region: Secondary | ICD-10-CM | POA: Diagnosis not present

## 2016-04-06 DIAGNOSIS — M5416 Radiculopathy, lumbar region: Secondary | ICD-10-CM | POA: Diagnosis not present

## 2016-04-06 DIAGNOSIS — R262 Difficulty in walking, not elsewhere classified: Secondary | ICD-10-CM | POA: Diagnosis not present

## 2016-04-08 DIAGNOSIS — M5416 Radiculopathy, lumbar region: Secondary | ICD-10-CM | POA: Diagnosis not present

## 2016-04-08 DIAGNOSIS — R262 Difficulty in walking, not elsewhere classified: Secondary | ICD-10-CM | POA: Diagnosis not present

## 2016-04-08 DIAGNOSIS — M5116 Intervertebral disc disorders with radiculopathy, lumbar region: Secondary | ICD-10-CM | POA: Diagnosis not present

## 2016-04-09 ENCOUNTER — Encounter: Payer: Federal, State, Local not specified - PPO | Admitting: Physician Assistant

## 2016-04-14 DIAGNOSIS — Z6826 Body mass index (BMI) 26.0-26.9, adult: Secondary | ICD-10-CM | POA: Diagnosis not present

## 2016-04-14 DIAGNOSIS — M5416 Radiculopathy, lumbar region: Secondary | ICD-10-CM | POA: Diagnosis not present

## 2016-04-23 ENCOUNTER — Ambulatory Visit (INDEPENDENT_AMBULATORY_CARE_PROVIDER_SITE_OTHER): Payer: Federal, State, Local not specified - PPO | Admitting: Physician Assistant

## 2016-04-23 ENCOUNTER — Encounter: Payer: Self-pay | Admitting: Physician Assistant

## 2016-04-23 VITALS — BP 131/82 | HR 70 | Ht 69.5 in | Wt 181.0 lb

## 2016-04-23 DIAGNOSIS — R319 Hematuria, unspecified: Secondary | ICD-10-CM

## 2016-04-23 DIAGNOSIS — Z23 Encounter for immunization: Secondary | ICD-10-CM | POA: Diagnosis not present

## 2016-04-23 DIAGNOSIS — M5416 Radiculopathy, lumbar region: Secondary | ICD-10-CM

## 2016-04-23 DIAGNOSIS — Z131 Encounter for screening for diabetes mellitus: Secondary | ICD-10-CM | POA: Diagnosis not present

## 2016-04-23 DIAGNOSIS — Z13220 Encounter for screening for lipoid disorders: Secondary | ICD-10-CM | POA: Diagnosis not present

## 2016-04-23 DIAGNOSIS — M5126 Other intervertebral disc displacement, lumbar region: Secondary | ICD-10-CM

## 2016-04-23 DIAGNOSIS — Z0000 Encounter for general adult medical examination without abnormal findings: Secondary | ICD-10-CM

## 2016-04-23 LAB — POCT URINALYSIS DIPSTICK
Bilirubin, UA: NEGATIVE
Glucose, UA: NEGATIVE
Ketones, UA: NEGATIVE
Leukocytes, UA: NEGATIVE
Nitrite, UA: NEGATIVE
Protein, UA: NEGATIVE
Spec Grav, UA: 1.015
Urobilinogen, UA: 0.2
pH, UA: 7

## 2016-04-23 MED ORDER — AMITRIPTYLINE HCL 50 MG PO TABS: 50.0000 mg | ORAL_TABLET | Freq: Every day | ORAL | 5 refills | Status: DC

## 2016-04-23 MED ORDER — CELECOXIB 200 MG PO CAPS: 200.0000 mg | ORAL_CAPSULE | Freq: Two times a day (BID) | ORAL | 1 refills | Status: DC

## 2016-04-23 NOTE — Patient Instructions (Signed)

## 2016-04-23 NOTE — Progress Notes (Signed)
You do have some blood in urine. We should recheck in 2 weeks as this should not be in urine.

## 2016-04-23 NOTE — Progress Notes (Signed)
Subjective:     Monica Brooks is a 55 y.o. female and is here for a comprehensive physical exam. The patient reports problems - she had laminectomy on 9/13. she is still in rehab and not released for full activity. she is doing well. no concerns or compliants. .  Social History   Social History  . Marital status: Single    Spouse name: N/A  . Number of children: 2  . Years of education: N/A   Occupational History  . management [employer]   Social History Main Topics  . Smoking status: Current Every Day Smoker    Packs/day: 0.50    Years: 25.00    Types: Cigarettes  . Smokeless tobacco: Never Used  . Alcohol use No  . Drug use: No  . Sexual activity: Yes   Other Topics Concern  . Not on file   Social History Narrative  . No narrative on file   Health Maintenance  Topic Date Due  . Hepatitis C Screening  07-19-1960  . HIV Screening  12/29/1973  . TETANUS/TDAP  12/29/1977  . INFLUENZA VACCINE  01/27/2016  . PAP SMEAR  06/28/2016  . MAMMOGRAM  10/14/2017  . COLONOSCOPY  06/28/2021    The following portions of the patient's history were reviewed and updated as appropriate: allergies, current medications, past family history, past medical history, past social history, past surgical history and problem list.  Review of Systems Pertinent items noted in HPI and remainder of comprehensive ROS otherwise negative.   Objective:    BP 131/82   Pulse 70   Ht 5' 9.5" (1.765 m)   Wt 181 lb (82.1 kg)   BMI 26.35 kg/m  General appearance: alert, cooperative and appears stated age Head: Normocephalic, without obvious abnormality, atraumatic Eyes: conjunctivae/corneas clear. PERRL, EOM's intact. Fundi benign. Ears: normal TM's and external ear canals both ears Nose: Nares normal. Septum midline. Mucosa normal. No drainage or sinus tenderness. Throat: lips, mucosa, and tongue normal; teeth and gums normal Neck: no adenopathy, no carotid bruit, no JVD, supple, symmetrical, trachea  midline and thyroid not enlarged, symmetric, no tenderness/mass/nodules Back: symmetric, no curvature. ROM normal. No CVA tenderness. Lungs: clear to auscultation bilaterally Breasts: normal appearance, no masses or tenderness Heart: regular rate and rhythm, S1, S2 normal, no murmur, click, rub or gallop Abdomen: soft, non-tender; bowel sounds normal; no masses,  no organomegaly Extremities: extremities normal, atraumatic, no cyanosis or edema left lower extremity non pitting edema Pulses: 2+ and symmetric Skin: Skin color, texture, turgor normal. No rashes or lesions Lymph nodes: Cervical, supraclavicular, and axillary nodes normal. Neurologic: Alert and oriented X 3, normal strength and tone. Normal symmetric reflexes. Normal coordination and gait    Assessment:    Healthy female exam.      Plan:  Marland KitchenMarland KitchenConleigh was seen today for annual exam.  Diagnoses and all orders for this visit:  Routine physical examination -     Lipid panel -     COMPLETE METABOLIC PANEL WITH GFR -     TSH -     Unexplained Fatigue Profile -     POCT urinalysis dipstick  Influenza vaccine needed -     Flu Vaccine QUAD 36+ mos PF IM (Fluarix & Fluzone Quad PF)  Screening for lipid disorders -     Lipid panel  Screening for diabetes mellitus -     COMPLETE METABOLIC PANEL WITH GFR  Hematuria, unspecified type  Lumbar disc herniation -     celecoxib (CELEBREX)  200 MG capsule; Take 1 capsule (200 mg total) by mouth 2 (two) times daily. -     amitriptyline (ELAVIL) 50 MG tablet; Take 1 tablet (50 mg total) by mouth at bedtime.  Lumbar radiculopathy -     celecoxib (CELEBREX) 200 MG capsule; Take 1 capsule (200 mg total) by mouth 2 (two) times daily. -     amitriptyline (ELAVIL) 50 MG tablet; Take 1 tablet (50 mg total) by mouth at bedtime.   Dicussed vitamin D and calcium.  Encouraged exercise once released from ortho.  Flu shot given today.  Discussed Tdap but declined today.  Blood found in urine  will recheck in 2 weeks to make sure has cleared.    See After Visit Summary for Counseling Recommendations

## 2016-04-24 LAB — LIPID PANEL
CHOL/HDL RATIO: 2.8 ratio (ref <=5.0)
Cholesterol: 169 mg/dL (ref 125–200)
HDL: 61 mg/dL (ref >=46)
LDL Cholesterol: 83 mg/dL (ref <130)
Triglycerides: 127 mg/dL (ref <150)
VLDL: 25 mg/dL (ref <30)

## 2016-04-24 LAB — COMPLETE METABOLIC PANEL WITH GFR
ALBUMIN: 4.4 g/dL (ref 3.6–5.1)
ALK PHOS: 96 U/L (ref 33–130)
ALT: 62 U/L — ABNORMAL HIGH (ref 6–29)
AST: 66 U/L — ABNORMAL HIGH (ref 10–35)
BUN: 12 mg/dL (ref 7–25)
CO2: 27 mmol/L (ref 20–31)
Calcium: 9.5 mg/dL (ref 8.6–10.4)
Chloride: 106 mmol/L (ref 98–110)
Creat: 0.62 mg/dL (ref 0.50–1.05)
GFR, Est African American: >89 mL/min (ref >=60)
Glucose, Bld: 97 mg/dL (ref 65–99)
POTASSIUM: 4.4 mmol/L (ref 3.5–5.3)
SODIUM: 140 mmol/L (ref 135–146)
Total Bilirubin: 0.7 mg/dL (ref 0.2–1.2)
Total Protein: 6.9 g/dL (ref 6.1–8.1)

## 2016-04-24 LAB — VITAMIN D 25 HYDROXY (VIT D DEFICIENCY, FRACTURES): VIT D 25 HYDROXY: 29 ng/mL — AB (ref 30–100)

## 2016-04-24 LAB — VITAMIN B12: Vitamin B-12: 728 pg/mL (ref 200–1100)

## 2016-04-24 LAB — TSH: TSH: 0.72 m[IU]/L

## 2016-04-24 LAB — CBC
HCT: 41.9 % (ref 35.0–45.0)
Hemoglobin: 13.7 g/dL (ref 11.7–15.5)
MCH: 28.5 pg (ref 27.0–33.0)
MCHC: 32.7 g/dL (ref 32.0–36.0)
MCV: 87.3 fL (ref 80.0–100.0)
MPV: 11.6 fL (ref 7.5–12.5)
PLATELETS: 210 10*3/uL (ref 140–400)
RBC: 4.8 MIL/uL (ref 3.80–5.10)
RDW: 14.6 % (ref 11.0–15.0)
WBC: 5.6 10*3/uL (ref 3.8–10.8)

## 2016-04-24 LAB — SEDIMENTATION RATE: SED RATE: 1 mm/h (ref 0–30)

## 2016-04-24 LAB — FERRITIN: FERRITIN: 21 ng/mL (ref 10–232)

## 2016-04-25 ENCOUNTER — Encounter: Payer: Self-pay | Admitting: Physician Assistant

## 2016-04-25 DIAGNOSIS — R790 Abnormal level of blood mineral: Secondary | ICD-10-CM | POA: Insufficient documentation

## 2016-04-25 DIAGNOSIS — R748 Abnormal levels of other serum enzymes: Secondary | ICD-10-CM | POA: Insufficient documentation

## 2016-04-27 LAB — ANTI-DNA ANTIBODY, DOUBLE-STRANDED: ds DNA Ab: <1 IU/mL

## 2016-04-27 LAB — CYCLIC CITRUL PEPTIDE ANTIBODY, IGG

## 2016-04-27 LAB — THYROID PEROXIDASE ANTIBODY: Thyroperoxidase Ab SerPl-aCnc: <1 IU/mL (ref <9)

## 2016-04-27 LAB — ANA: Anti Nuclear Antibody(ANA): NEGATIVE

## 2016-04-27 NOTE — Progress Notes (Signed)
Call pt: ANA is negative. Autoimmune marker

## 2016-04-29 LAB — GLIADIN DEAMIDATED PEPT AB,IGG: GLIADIN IGG: 2 U (ref <20)

## 2016-04-29 LAB — GLIADIN DEAMIDATED PEPT AB,IGA: GLIADIN IGA: 6 U (ref <20)

## 2016-05-04 ENCOUNTER — Other Ambulatory Visit: Payer: Self-pay | Admitting: Physician Assistant

## 2016-05-14 DIAGNOSIS — Z6826 Body mass index (BMI) 26.0-26.9, adult: Secondary | ICD-10-CM | POA: Diagnosis not present

## 2016-05-14 DIAGNOSIS — M5416 Radiculopathy, lumbar region: Secondary | ICD-10-CM | POA: Diagnosis not present

## 2016-06-25 DIAGNOSIS — J019 Acute sinusitis, unspecified: Secondary | ICD-10-CM | POA: Diagnosis not present

## 2016-07-12 DIAGNOSIS — Z6826 Body mass index (BMI) 26.0-26.9, adult: Secondary | ICD-10-CM | POA: Diagnosis not present

## 2016-07-12 DIAGNOSIS — M542 Cervicalgia: Secondary | ICD-10-CM | POA: Diagnosis not present

## 2016-07-12 DIAGNOSIS — M533 Sacrococcygeal disorders, not elsewhere classified: Secondary | ICD-10-CM | POA: Diagnosis not present

## 2016-07-12 DIAGNOSIS — M5416 Radiculopathy, lumbar region: Secondary | ICD-10-CM | POA: Diagnosis not present

## 2016-08-26 DIAGNOSIS — E785 Hyperlipidemia, unspecified: Secondary | ICD-10-CM | POA: Diagnosis not present

## 2016-08-26 DIAGNOSIS — K219 Gastro-esophageal reflux disease without esophagitis: Secondary | ICD-10-CM | POA: Diagnosis not present

## 2016-08-26 DIAGNOSIS — M8580 Other specified disorders of bone density and structure, unspecified site: Secondary | ICD-10-CM | POA: Diagnosis not present

## 2016-08-26 DIAGNOSIS — S3991XA Unspecified injury of abdomen, initial encounter: Secondary | ICD-10-CM | POA: Diagnosis not present

## 2016-08-26 DIAGNOSIS — S3993XA Unspecified injury of pelvis, initial encounter: Secondary | ICD-10-CM | POA: Diagnosis not present

## 2016-08-26 DIAGNOSIS — F17210 Nicotine dependence, cigarettes, uncomplicated: Secondary | ICD-10-CM | POA: Diagnosis not present

## 2016-08-26 DIAGNOSIS — S8255XA Nondisplaced fracture of medial malleolus of left tibia, initial encounter for closed fracture: Secondary | ICD-10-CM | POA: Diagnosis not present

## 2016-08-26 DIAGNOSIS — Z888 Allergy status to other drugs, medicaments and biological substances status: Secondary | ICD-10-CM | POA: Diagnosis not present

## 2016-08-26 DIAGNOSIS — M25572 Pain in left ankle and joints of left foot: Secondary | ICD-10-CM | POA: Diagnosis not present

## 2016-08-26 DIAGNOSIS — S199XXA Unspecified injury of neck, initial encounter: Secondary | ICD-10-CM | POA: Diagnosis not present

## 2016-08-26 DIAGNOSIS — Z880 Allergy status to penicillin: Secondary | ICD-10-CM | POA: Diagnosis not present

## 2016-08-26 DIAGNOSIS — Z8669 Personal history of other diseases of the nervous system and sense organs: Secondary | ICD-10-CM | POA: Diagnosis not present

## 2016-08-26 DIAGNOSIS — S82892A Other fracture of left lower leg, initial encounter for closed fracture: Secondary | ICD-10-CM | POA: Diagnosis not present

## 2016-08-26 DIAGNOSIS — Z79899 Other long term (current) drug therapy: Secondary | ICD-10-CM | POA: Diagnosis not present

## 2016-08-26 DIAGNOSIS — I700 Atherosclerosis of aorta: Secondary | ICD-10-CM | POA: Diagnosis not present

## 2016-08-26 DIAGNOSIS — S298XXA Other specified injuries of thorax, initial encounter: Secondary | ICD-10-CM | POA: Diagnosis not present

## 2016-08-26 DIAGNOSIS — N83202 Unspecified ovarian cyst, left side: Secondary | ICD-10-CM | POA: Diagnosis not present

## 2016-08-26 DIAGNOSIS — M549 Dorsalgia, unspecified: Secondary | ICD-10-CM | POA: Diagnosis not present

## 2016-08-26 DIAGNOSIS — S20211A Contusion of right front wall of thorax, initial encounter: Secondary | ICD-10-CM | POA: Diagnosis not present

## 2016-08-26 DIAGNOSIS — G8929 Other chronic pain: Secondary | ICD-10-CM | POA: Diagnosis not present

## 2016-08-30 DIAGNOSIS — M25562 Pain in left knee: Secondary | ICD-10-CM | POA: Diagnosis not present

## 2016-08-30 DIAGNOSIS — G5601 Carpal tunnel syndrome, right upper limb: Secondary | ICD-10-CM | POA: Diagnosis not present

## 2016-09-06 DIAGNOSIS — S8252XA Displaced fracture of medial malleolus of left tibia, initial encounter for closed fracture: Secondary | ICD-10-CM | POA: Diagnosis not present

## 2016-09-06 DIAGNOSIS — G8918 Other acute postprocedural pain: Secondary | ICD-10-CM | POA: Diagnosis not present

## 2016-09-06 DIAGNOSIS — S8254XA Nondisplaced fracture of medial malleolus of right tibia, initial encounter for closed fracture: Secondary | ICD-10-CM | POA: Diagnosis not present

## 2016-09-06 DIAGNOSIS — Y999 Unspecified external cause status: Secondary | ICD-10-CM | POA: Diagnosis not present

## 2016-09-16 DIAGNOSIS — S8252XD Displaced fracture of medial malleolus of left tibia, subsequent encounter for closed fracture with routine healing: Secondary | ICD-10-CM | POA: Diagnosis not present

## 2016-10-11 DIAGNOSIS — S8252XD Displaced fracture of medial malleolus of left tibia, subsequent encounter for closed fracture with routine healing: Secondary | ICD-10-CM | POA: Diagnosis not present

## 2016-10-21 DIAGNOSIS — M25672 Stiffness of left ankle, not elsewhere classified: Secondary | ICD-10-CM | POA: Diagnosis not present

## 2016-10-21 DIAGNOSIS — R262 Difficulty in walking, not elsewhere classified: Secondary | ICD-10-CM | POA: Diagnosis not present

## 2016-10-21 DIAGNOSIS — M25572 Pain in left ankle and joints of left foot: Secondary | ICD-10-CM | POA: Diagnosis not present

## 2016-10-21 DIAGNOSIS — M6281 Muscle weakness (generalized): Secondary | ICD-10-CM | POA: Diagnosis not present

## 2016-10-25 DIAGNOSIS — M542 Cervicalgia: Secondary | ICD-10-CM | POA: Diagnosis not present

## 2016-10-25 DIAGNOSIS — M5416 Radiculopathy, lumbar region: Secondary | ICD-10-CM | POA: Diagnosis not present

## 2016-10-25 DIAGNOSIS — G5603 Carpal tunnel syndrome, bilateral upper limbs: Secondary | ICD-10-CM | POA: Diagnosis not present

## 2016-10-26 DIAGNOSIS — M25572 Pain in left ankle and joints of left foot: Secondary | ICD-10-CM | POA: Diagnosis not present

## 2016-10-26 DIAGNOSIS — R262 Difficulty in walking, not elsewhere classified: Secondary | ICD-10-CM | POA: Diagnosis not present

## 2016-10-26 DIAGNOSIS — M25672 Stiffness of left ankle, not elsewhere classified: Secondary | ICD-10-CM | POA: Diagnosis not present

## 2016-10-26 DIAGNOSIS — M6281 Muscle weakness (generalized): Secondary | ICD-10-CM | POA: Diagnosis not present

## 2016-10-28 DIAGNOSIS — M25572 Pain in left ankle and joints of left foot: Secondary | ICD-10-CM | POA: Diagnosis not present

## 2016-10-28 DIAGNOSIS — M25672 Stiffness of left ankle, not elsewhere classified: Secondary | ICD-10-CM | POA: Diagnosis not present

## 2016-10-28 DIAGNOSIS — M6281 Muscle weakness (generalized): Secondary | ICD-10-CM | POA: Diagnosis not present

## 2016-10-28 DIAGNOSIS — R262 Difficulty in walking, not elsewhere classified: Secondary | ICD-10-CM | POA: Diagnosis not present

## 2016-11-02 DIAGNOSIS — M25672 Stiffness of left ankle, not elsewhere classified: Secondary | ICD-10-CM | POA: Diagnosis not present

## 2016-11-02 DIAGNOSIS — M6281 Muscle weakness (generalized): Secondary | ICD-10-CM | POA: Diagnosis not present

## 2016-11-02 DIAGNOSIS — R262 Difficulty in walking, not elsewhere classified: Secondary | ICD-10-CM | POA: Diagnosis not present

## 2016-11-02 DIAGNOSIS — M25572 Pain in left ankle and joints of left foot: Secondary | ICD-10-CM | POA: Diagnosis not present

## 2016-11-04 DIAGNOSIS — R262 Difficulty in walking, not elsewhere classified: Secondary | ICD-10-CM | POA: Diagnosis not present

## 2016-11-04 DIAGNOSIS — M6281 Muscle weakness (generalized): Secondary | ICD-10-CM | POA: Diagnosis not present

## 2016-11-04 DIAGNOSIS — M25572 Pain in left ankle and joints of left foot: Secondary | ICD-10-CM | POA: Diagnosis not present

## 2016-11-04 DIAGNOSIS — M25672 Stiffness of left ankle, not elsewhere classified: Secondary | ICD-10-CM | POA: Diagnosis not present

## 2016-11-09 DIAGNOSIS — M6281 Muscle weakness (generalized): Secondary | ICD-10-CM | POA: Diagnosis not present

## 2016-11-09 DIAGNOSIS — M25572 Pain in left ankle and joints of left foot: Secondary | ICD-10-CM | POA: Diagnosis not present

## 2016-11-09 DIAGNOSIS — M25672 Stiffness of left ankle, not elsewhere classified: Secondary | ICD-10-CM | POA: Diagnosis not present

## 2016-11-09 DIAGNOSIS — R262 Difficulty in walking, not elsewhere classified: Secondary | ICD-10-CM | POA: Diagnosis not present

## 2016-11-11 DIAGNOSIS — M25672 Stiffness of left ankle, not elsewhere classified: Secondary | ICD-10-CM | POA: Diagnosis not present

## 2016-11-11 DIAGNOSIS — M6281 Muscle weakness (generalized): Secondary | ICD-10-CM | POA: Diagnosis not present

## 2016-11-11 DIAGNOSIS — R262 Difficulty in walking, not elsewhere classified: Secondary | ICD-10-CM | POA: Diagnosis not present

## 2016-11-11 DIAGNOSIS — M25572 Pain in left ankle and joints of left foot: Secondary | ICD-10-CM | POA: Diagnosis not present

## 2016-11-15 DIAGNOSIS — M5416 Radiculopathy, lumbar region: Secondary | ICD-10-CM | POA: Diagnosis not present

## 2016-11-15 DIAGNOSIS — G5603 Carpal tunnel syndrome, bilateral upper limbs: Secondary | ICD-10-CM | POA: Diagnosis not present

## 2016-11-16 DIAGNOSIS — M6281 Muscle weakness (generalized): Secondary | ICD-10-CM | POA: Diagnosis not present

## 2016-11-16 DIAGNOSIS — M25572 Pain in left ankle and joints of left foot: Secondary | ICD-10-CM | POA: Diagnosis not present

## 2016-11-16 DIAGNOSIS — R262 Difficulty in walking, not elsewhere classified: Secondary | ICD-10-CM | POA: Diagnosis not present

## 2016-11-16 DIAGNOSIS — M25672 Stiffness of left ankle, not elsewhere classified: Secondary | ICD-10-CM | POA: Diagnosis not present

## 2016-11-18 DIAGNOSIS — M6281 Muscle weakness (generalized): Secondary | ICD-10-CM | POA: Diagnosis not present

## 2016-11-18 DIAGNOSIS — M25572 Pain in left ankle and joints of left foot: Secondary | ICD-10-CM | POA: Diagnosis not present

## 2016-11-18 DIAGNOSIS — M25672 Stiffness of left ankle, not elsewhere classified: Secondary | ICD-10-CM | POA: Diagnosis not present

## 2016-11-18 DIAGNOSIS — R262 Difficulty in walking, not elsewhere classified: Secondary | ICD-10-CM | POA: Diagnosis not present

## 2016-11-23 DIAGNOSIS — M6281 Muscle weakness (generalized): Secondary | ICD-10-CM | POA: Diagnosis not present

## 2016-11-23 DIAGNOSIS — R262 Difficulty in walking, not elsewhere classified: Secondary | ICD-10-CM | POA: Diagnosis not present

## 2016-11-23 DIAGNOSIS — M25672 Stiffness of left ankle, not elsewhere classified: Secondary | ICD-10-CM | POA: Diagnosis not present

## 2016-11-23 DIAGNOSIS — M25572 Pain in left ankle and joints of left foot: Secondary | ICD-10-CM | POA: Diagnosis not present

## 2016-11-25 DIAGNOSIS — M25572 Pain in left ankle and joints of left foot: Secondary | ICD-10-CM | POA: Diagnosis not present

## 2016-11-25 DIAGNOSIS — M25672 Stiffness of left ankle, not elsewhere classified: Secondary | ICD-10-CM | POA: Diagnosis not present

## 2016-11-25 DIAGNOSIS — R262 Difficulty in walking, not elsewhere classified: Secondary | ICD-10-CM | POA: Diagnosis not present

## 2016-11-25 DIAGNOSIS — M6281 Muscle weakness (generalized): Secondary | ICD-10-CM | POA: Diagnosis not present

## 2016-11-30 DIAGNOSIS — M25572 Pain in left ankle and joints of left foot: Secondary | ICD-10-CM | POA: Diagnosis not present

## 2016-11-30 DIAGNOSIS — M6281 Muscle weakness (generalized): Secondary | ICD-10-CM | POA: Diagnosis not present

## 2016-11-30 DIAGNOSIS — M25672 Stiffness of left ankle, not elsewhere classified: Secondary | ICD-10-CM | POA: Diagnosis not present

## 2016-11-30 DIAGNOSIS — R262 Difficulty in walking, not elsewhere classified: Secondary | ICD-10-CM | POA: Diagnosis not present

## 2016-12-02 DIAGNOSIS — R262 Difficulty in walking, not elsewhere classified: Secondary | ICD-10-CM | POA: Diagnosis not present

## 2016-12-02 DIAGNOSIS — M6281 Muscle weakness (generalized): Secondary | ICD-10-CM | POA: Diagnosis not present

## 2016-12-02 DIAGNOSIS — M25572 Pain in left ankle and joints of left foot: Secondary | ICD-10-CM | POA: Diagnosis not present

## 2016-12-02 DIAGNOSIS — M25672 Stiffness of left ankle, not elsewhere classified: Secondary | ICD-10-CM | POA: Diagnosis not present

## 2016-12-07 DIAGNOSIS — M6281 Muscle weakness (generalized): Secondary | ICD-10-CM | POA: Diagnosis not present

## 2016-12-07 DIAGNOSIS — R262 Difficulty in walking, not elsewhere classified: Secondary | ICD-10-CM | POA: Diagnosis not present

## 2016-12-07 DIAGNOSIS — M25672 Stiffness of left ankle, not elsewhere classified: Secondary | ICD-10-CM | POA: Diagnosis not present

## 2016-12-07 DIAGNOSIS — M25572 Pain in left ankle and joints of left foot: Secondary | ICD-10-CM | POA: Diagnosis not present

## 2016-12-09 DIAGNOSIS — M25572 Pain in left ankle and joints of left foot: Secondary | ICD-10-CM | POA: Diagnosis not present

## 2016-12-09 DIAGNOSIS — R262 Difficulty in walking, not elsewhere classified: Secondary | ICD-10-CM | POA: Diagnosis not present

## 2016-12-09 DIAGNOSIS — M25672 Stiffness of left ankle, not elsewhere classified: Secondary | ICD-10-CM | POA: Diagnosis not present

## 2016-12-09 DIAGNOSIS — S8252XD Displaced fracture of medial malleolus of left tibia, subsequent encounter for closed fracture with routine healing: Secondary | ICD-10-CM | POA: Diagnosis not present

## 2016-12-13 DIAGNOSIS — M5416 Radiculopathy, lumbar region: Secondary | ICD-10-CM | POA: Diagnosis not present

## 2016-12-13 DIAGNOSIS — M48062 Spinal stenosis, lumbar region with neurogenic claudication: Secondary | ICD-10-CM | POA: Diagnosis not present

## 2016-12-14 DIAGNOSIS — R262 Difficulty in walking, not elsewhere classified: Secondary | ICD-10-CM | POA: Diagnosis not present

## 2016-12-14 DIAGNOSIS — M25572 Pain in left ankle and joints of left foot: Secondary | ICD-10-CM | POA: Diagnosis not present

## 2016-12-14 DIAGNOSIS — M6281 Muscle weakness (generalized): Secondary | ICD-10-CM | POA: Diagnosis not present

## 2016-12-14 DIAGNOSIS — M25672 Stiffness of left ankle, not elsewhere classified: Secondary | ICD-10-CM | POA: Diagnosis not present

## 2016-12-16 DIAGNOSIS — M25572 Pain in left ankle and joints of left foot: Secondary | ICD-10-CM | POA: Diagnosis not present

## 2016-12-16 DIAGNOSIS — M25672 Stiffness of left ankle, not elsewhere classified: Secondary | ICD-10-CM | POA: Diagnosis not present

## 2016-12-16 DIAGNOSIS — M6281 Muscle weakness (generalized): Secondary | ICD-10-CM | POA: Diagnosis not present

## 2016-12-16 DIAGNOSIS — R262 Difficulty in walking, not elsewhere classified: Secondary | ICD-10-CM | POA: Diagnosis not present

## 2016-12-19 ENCOUNTER — Other Ambulatory Visit: Payer: Self-pay | Admitting: Physician Assistant

## 2016-12-19 DIAGNOSIS — M5416 Radiculopathy, lumbar region: Secondary | ICD-10-CM

## 2016-12-19 DIAGNOSIS — M5126 Other intervertebral disc displacement, lumbar region: Secondary | ICD-10-CM

## 2016-12-21 DIAGNOSIS — M6281 Muscle weakness (generalized): Secondary | ICD-10-CM | POA: Diagnosis not present

## 2016-12-21 DIAGNOSIS — M25672 Stiffness of left ankle, not elsewhere classified: Secondary | ICD-10-CM | POA: Diagnosis not present

## 2016-12-21 DIAGNOSIS — M25572 Pain in left ankle and joints of left foot: Secondary | ICD-10-CM | POA: Diagnosis not present

## 2016-12-21 DIAGNOSIS — R262 Difficulty in walking, not elsewhere classified: Secondary | ICD-10-CM | POA: Diagnosis not present

## 2016-12-23 DIAGNOSIS — M25672 Stiffness of left ankle, not elsewhere classified: Secondary | ICD-10-CM | POA: Diagnosis not present

## 2016-12-23 DIAGNOSIS — R262 Difficulty in walking, not elsewhere classified: Secondary | ICD-10-CM | POA: Diagnosis not present

## 2016-12-23 DIAGNOSIS — M25572 Pain in left ankle and joints of left foot: Secondary | ICD-10-CM | POA: Diagnosis not present

## 2016-12-23 DIAGNOSIS — M6281 Muscle weakness (generalized): Secondary | ICD-10-CM | POA: Diagnosis not present

## 2016-12-28 DIAGNOSIS — M65311 Trigger thumb, right thumb: Secondary | ICD-10-CM | POA: Diagnosis not present

## 2016-12-28 DIAGNOSIS — R262 Difficulty in walking, not elsewhere classified: Secondary | ICD-10-CM | POA: Diagnosis not present

## 2016-12-28 DIAGNOSIS — M65312 Trigger thumb, left thumb: Secondary | ICD-10-CM | POA: Diagnosis not present

## 2016-12-28 DIAGNOSIS — M6281 Muscle weakness (generalized): Secondary | ICD-10-CM | POA: Diagnosis not present

## 2016-12-28 DIAGNOSIS — M25572 Pain in left ankle and joints of left foot: Secondary | ICD-10-CM | POA: Diagnosis not present

## 2016-12-28 DIAGNOSIS — M25672 Stiffness of left ankle, not elsewhere classified: Secondary | ICD-10-CM | POA: Diagnosis not present

## 2016-12-30 DIAGNOSIS — R262 Difficulty in walking, not elsewhere classified: Secondary | ICD-10-CM | POA: Diagnosis not present

## 2016-12-30 DIAGNOSIS — M25572 Pain in left ankle and joints of left foot: Secondary | ICD-10-CM | POA: Diagnosis not present

## 2016-12-30 DIAGNOSIS — M25672 Stiffness of left ankle, not elsewhere classified: Secondary | ICD-10-CM | POA: Diagnosis not present

## 2016-12-30 DIAGNOSIS — M6281 Muscle weakness (generalized): Secondary | ICD-10-CM | POA: Diagnosis not present

## 2017-01-13 DIAGNOSIS — M542 Cervicalgia: Secondary | ICD-10-CM | POA: Diagnosis not present

## 2017-01-13 DIAGNOSIS — G5603 Carpal tunnel syndrome, bilateral upper limbs: Secondary | ICD-10-CM | POA: Diagnosis not present

## 2017-01-13 DIAGNOSIS — M5416 Radiculopathy, lumbar region: Secondary | ICD-10-CM | POA: Diagnosis not present

## 2017-04-19 DIAGNOSIS — M5412 Radiculopathy, cervical region: Secondary | ICD-10-CM | POA: Diagnosis not present

## 2017-04-19 DIAGNOSIS — M5416 Radiculopathy, lumbar region: Secondary | ICD-10-CM | POA: Diagnosis not present

## 2017-04-19 DIAGNOSIS — G5603 Carpal tunnel syndrome, bilateral upper limbs: Secondary | ICD-10-CM | POA: Diagnosis not present

## 2017-05-13 ENCOUNTER — Encounter: Payer: Self-pay | Admitting: Family Medicine

## 2017-05-13 ENCOUNTER — Ambulatory Visit (INDEPENDENT_AMBULATORY_CARE_PROVIDER_SITE_OTHER): Payer: Federal, State, Local not specified - PPO | Admitting: Family Medicine

## 2017-05-13 VITALS — BP 162/102 | HR 96 | Temp 99.2°F | Ht 70.0 in | Wt 177.0 lb

## 2017-05-13 DIAGNOSIS — J0100 Acute maxillary sinusitis, unspecified: Secondary | ICD-10-CM

## 2017-05-13 MED ORDER — AZITHROMYCIN 250 MG PO TABS: ORAL_TABLET | ORAL | 0 refills | Status: AC

## 2017-05-13 NOTE — Progress Notes (Signed)
   Subjective:    Patient ID: Monica Brooks, female    DOB: October 12, 1960, 56 y.o.   MRN: [id number]  HPI Cough and sinus pressure x 1 month but worse over the last week.  Now she has a very sore right throat in her right maxillary sinus is painful.  Coughing up green mucous. Pain in her forehead.  Has been taking several OTC products.  She feels like she has had some fevers on and off.  No nausea vomiting or diarrhea.  She is been taking TheraFlu, Mucinex, Vicks 44.  No significant relief with these.  She is also using nasal saline.   Review of Systems     Objective:   Physical Exam  Constitutional: She is oriented to person, place, and time. She appears well-developed and well-nourished.  HENT:  Head: Normocephalic and atraumatic.  Right Ear: External ear normal.  Left Ear: External ear normal.  Nose: Nose normal.  Mouth/Throat: Oropharynx is clear and moist.  TMs and canals are clear.  Tender over her right maxillary sinus and over the right anterior cervical region.  Eyes: Conjunctivae and EOM are normal. Pupils are equal, round, and reactive to light.  Neck: Neck supple. No thyromegaly present.  Cardiovascular: Normal rate, regular rhythm and normal heart sounds.  Pulmonary/Chest: Effort normal and breath sounds normal. She has no wheezes.  Lymphadenopathy:    She has no cervical adenopathy.  Neurological: She is alert and oriented to person, place, and time.  Skin: Skin is warm and dry.  Psychiatric: She has a normal mood and affect.          Assessment & Plan:  Acute right maxillary sinusitis-we will treat with azithromycin.  Continue with hydration, nasal saline and humidifier.  Okay to continue over-the-counter medications.  Call if not improving.  Elevated blood pressure-likely from cold medications.  She was recently here and her blood pressure looks great.

## 2017-05-13 NOTE — Patient Instructions (Addendum)

## 2017-05-16 ENCOUNTER — Telehealth: Payer: Self-pay

## 2017-05-16 NOTE — Telephone Encounter (Signed)
Okay for work note.  If diarrhea is not improving over the next day or 2 then please let me know.  Make sure hydrating well.

## 2017-05-16 NOTE — Telephone Encounter (Signed)
Murielle called and states the azithromycin is causing her to have diarrhea. She denies fever, chills or sweats. Her symptoms from Friday are getting better. Please advise.    She would also like a work note for Friday and today.

## 2017-05-16 NOTE — Telephone Encounter (Signed)
I am sorry I did not mention she has had the diarrhea for the last 2 days.

## 2017-05-16 NOTE — Telephone Encounter (Signed)
OK to stop the antibiotic and if diarrhea doesn't resolve in then next 48 hours please let me know.

## 2017-05-17 NOTE — Telephone Encounter (Signed)
Left VM with PCP recommendation.

## 2017-06-23 ENCOUNTER — Other Ambulatory Visit: Payer: Self-pay | Admitting: Physician Assistant

## 2017-07-20 DIAGNOSIS — M5412 Radiculopathy, cervical region: Secondary | ICD-10-CM | POA: Diagnosis not present

## 2017-07-20 DIAGNOSIS — M5416 Radiculopathy, lumbar region: Secondary | ICD-10-CM | POA: Diagnosis not present

## 2017-07-20 DIAGNOSIS — M961 Postlaminectomy syndrome, not elsewhere classified: Secondary | ICD-10-CM | POA: Diagnosis not present

## 2017-10-13 DIAGNOSIS — M533 Sacrococcygeal disorders, not elsewhere classified: Secondary | ICD-10-CM | POA: Diagnosis not present

## 2017-10-13 DIAGNOSIS — M961 Postlaminectomy syndrome, not elsewhere classified: Secondary | ICD-10-CM | POA: Diagnosis not present

## 2017-10-27 DIAGNOSIS — M533 Sacrococcygeal disorders, not elsewhere classified: Secondary | ICD-10-CM | POA: Diagnosis not present

## 2017-11-01 ENCOUNTER — Other Ambulatory Visit: Payer: Self-pay | Admitting: Physician Assistant

## 2017-11-01 ENCOUNTER — Encounter: Payer: Self-pay | Admitting: Physician Assistant

## 2017-11-01 ENCOUNTER — Ambulatory Visit: Payer: Federal, State, Local not specified - PPO | Admitting: Physician Assistant

## 2017-11-01 VITALS — BP 148/94 | HR 64 | Ht 70.0 in | Wt 173.0 lb

## 2017-11-01 DIAGNOSIS — F17200 Nicotine dependence, unspecified, uncomplicated: Secondary | ICD-10-CM

## 2017-11-01 DIAGNOSIS — M5126 Other intervertebral disc displacement, lumbar region: Secondary | ICD-10-CM | POA: Diagnosis not present

## 2017-11-01 DIAGNOSIS — K2100 Gastro-esophageal reflux disease with esophagitis, without bleeding: Secondary | ICD-10-CM

## 2017-11-01 DIAGNOSIS — Z1159 Encounter for screening for other viral diseases: Secondary | ICD-10-CM

## 2017-11-01 DIAGNOSIS — M5416 Radiculopathy, lumbar region: Secondary | ICD-10-CM

## 2017-11-01 DIAGNOSIS — Z23 Encounter for immunization: Secondary | ICD-10-CM

## 2017-11-01 DIAGNOSIS — E782 Mixed hyperlipidemia: Secondary | ICD-10-CM

## 2017-11-01 DIAGNOSIS — Z0000 Encounter for general adult medical examination without abnormal findings: Secondary | ICD-10-CM

## 2017-11-01 DIAGNOSIS — Z131 Encounter for screening for diabetes mellitus: Secondary | ICD-10-CM

## 2017-11-01 DIAGNOSIS — Z1231 Encounter for screening mammogram for malignant neoplasm of breast: Secondary | ICD-10-CM

## 2017-11-01 MED ORDER — CELECOXIB 200 MG PO CAPS: 200.0000 mg | ORAL_CAPSULE | Freq: Two times a day (BID) | ORAL | 3 refills | Status: DC

## 2017-11-01 MED ORDER — AMITRIPTYLINE HCL 50 MG PO TABS: ORAL_TABLET | ORAL | 3 refills | Status: DC

## 2017-11-01 MED ORDER — LISINOPRIL 10 MG PO TABS: 10.0000 mg | ORAL_TABLET | Freq: Every day | ORAL | 1 refills | Status: DC

## 2017-11-01 MED ORDER — IBUPROFEN 800 MG PO TABS: 800.0000 mg | ORAL_TABLET | Freq: Three times a day (TID) | ORAL | 1 refills | Status: DC | PRN

## 2017-11-01 MED ORDER — ESOMEPRAZOLE MAGNESIUM 40 MG PO CPDR: DELAYED_RELEASE_CAPSULE | ORAL | 3 refills | Status: DC

## 2017-11-01 MED ORDER — ROSUVASTATIN CALCIUM 40 MG PO TABS: 40.0000 mg | ORAL_TABLET | Freq: Every day | ORAL | 3 refills | Status: DC

## 2017-11-01 NOTE — Progress Notes (Signed)
q 

## 2017-11-02 ENCOUNTER — Ambulatory Visit (INDEPENDENT_AMBULATORY_CARE_PROVIDER_SITE_OTHER): Payer: Federal, State, Local not specified - PPO

## 2017-11-02 DIAGNOSIS — Z131 Encounter for screening for diabetes mellitus: Secondary | ICD-10-CM | POA: Diagnosis not present

## 2017-11-02 DIAGNOSIS — E782 Mixed hyperlipidemia: Secondary | ICD-10-CM | POA: Diagnosis not present

## 2017-11-02 DIAGNOSIS — Z0000 Encounter for general adult medical examination without abnormal findings: Secondary | ICD-10-CM | POA: Diagnosis not present

## 2017-11-02 DIAGNOSIS — Z1159 Encounter for screening for other viral diseases: Secondary | ICD-10-CM | POA: Diagnosis not present

## 2017-11-02 DIAGNOSIS — Z1231 Encounter for screening mammogram for malignant neoplasm of breast: Secondary | ICD-10-CM | POA: Diagnosis not present

## 2017-11-02 NOTE — Progress Notes (Signed)
Call pt: normal mammogram. Follow up in 1 year.

## 2017-11-04 ENCOUNTER — Encounter: Payer: Self-pay | Admitting: Physician Assistant

## 2017-11-04 NOTE — Progress Notes (Signed)
Subjective:    Patient ID: Monica Brooks, female    DOB: Nov 24, 1960, 57 y.o.   MRN: [id number]  HPI Pt is a 57 yo female who presents to the clinic for medications refills and follow up.   Pt is doing well. Needs refills on all medications.   Pt continues to smoke. She has smoked for 30 plus years.   .. Active Ambulatory Problems    Diagnosis Date Noted  . Hyperlipidemia 09/15/2007  . Esophageal reflux 09/15/2007  . IRRITABLE BOWEL SYNDROME, HX OF 09/15/2007  . Hematemesis 11/04/2012  . Syncope 11/04/2012  . Stricture and stenosis of esophagus 11/04/2012  . Lumbar disc herniation 10/08/2015  . Migraine without aura and with status migrainosus, not intractable 10/08/2015  . Primary osteoarthritis involving multiple joints 10/11/2015  . Lumbar radiculopathy   . Hematuria 04/23/2016  . Low iron stores 04/25/2016  . Elevated liver enzymes 04/25/2016  . Current smoker 11/06/2017   Resolved Ambulatory Problems    Diagnosis Date Noted  . No Resolved Ambulatory Problems   Past Medical History:  Diagnosis Date  . Allergy   . Anemia   . Anxiety   . Arthritis   . Chronic headaches   . Colon polyps   . Esophagitis 1992  . GERD (gastroesophageal reflux disease)   . HLD (hyperlipidemia)   . HTN (hypertension)   . IBS (irritable bowel syndrome)   . Sliding hiatal hernia 1992  . Stricture of esophagus 1992  . Ulcer       Review of Systems  All other systems reviewed and are negative.      Objective:   Physical Exam  Constitutional: She is oriented to person, place, and time. She appears well-developed and well-nourished.  HENT:  Head: Normocephalic and atraumatic.  Neck: Normal range of motion. Neck supple.  Cardiovascular: Normal rate and regular rhythm.  Pulmonary/Chest: Effort normal and breath sounds normal.  Lymphadenopathy:    She has no cervical adenopathy.  Neurological: She is alert and oriented to person, place, and time.  Psychiatric: She has a normal  mood and affect. Her behavior is normal.          Assessment & Plan:  Marland KitchenMarland KitchenDiagnoses and all orders for this visit:  Preventative health care -     Lipid Panel w/reflex Direct LDL -     COMPLETE METABOLIC PANEL WITH GFR -     TSH -     MM 3D SCREEN BREAST BILATERAL -     Hepatitis C Antibody -     Vitamin D 1,25 dihydroxy -     B12 and Folate Panel -     CBC  Lumbar disc herniation -     amitriptyline (ELAVIL) 50 MG tablet; TAKE 1 TABLET(50 MG) BY MOUTH AT BEDTIME -     celecoxib (CELEBREX) 200 MG capsule; Take 1 capsule (200 mg total) by mouth 2 (two) times daily. -     ibuprofen (ADVIL,MOTRIN) 800 MG tablet; Take 1 tablet (800 mg total) by mouth every 8 (eight) hours as needed.  Lumbar radiculopathy -     amitriptyline (ELAVIL) 50 MG tablet; TAKE 1 TABLET(50 MG) BY MOUTH AT BEDTIME -     celecoxib (CELEBREX) 200 MG capsule; Take 1 capsule (200 mg total) by mouth 2 (two) times daily. -     ibuprofen (ADVIL,MOTRIN) 800 MG tablet; Take 1 tablet (800 mg total) by mouth every 8 (eight) hours as needed.  Screening for diabetes mellitus -  COMPLETE METABOLIC PANEL WITH GFR  Mixed hyperlipidemia -     Lipid Panel w/reflex Direct LDL -     rosuvastatin (CRESTOR) 40 MG tablet; Take 1 tablet (40 mg total) by mouth daily.  Visit for screening mammogram -     MM 3D SCREEN BREAST BILATERAL  Need for hepatitis C screening test -     Hepatitis C Antibody  Need for Tdap vaccination -     Tdap vaccine greater than or equal to 7yo IM  Gastroesophageal reflux disease with esophagitis -     esomeprazole (NEXIUM) 40 MG capsule; TAKE ONE CAPSULE BY MOUTH TWICE DAILY BEFORE A MEAL  Current smoker -     Ambulatory Referral for Lung Cancer Scre  Other orders -     Discontinue: lisinopril (PRINIVIL,ZESTRIL) 10 MG tablet; Take 1 tablet (10 mg total) by mouth daily.   .. Depression screen [facility] 2/9 11/01/2017 05/13/2017  Decreased Interest 0 0  Down, Depressed, Hopeless 0 0  PHQ - 2  Score 0 0   Pt has smoked for 30 plus years. Should qualify for lung cancer screenings.   Medication refilled.   BP not controlled today. Follow up in 2 weeks with BP recheck.   Vaccines up to date.  Mammogram up to date.  Colonoscopy up to date.

## 2017-11-05 LAB — LIPID PANEL W/REFLEX DIRECT LDL
CHOL/HDL RATIO: 4 (calc) (ref <5.0)
Cholesterol: 304 mg/dL — ABNORMAL HIGH (ref <200)
HDL: 76 mg/dL (ref >50)
LDL CHOLESTEROL (CALC): 200 mg/dL — AB
Non-HDL Cholesterol (Calc): 228 mg/dL (calc) — ABNORMAL HIGH (ref <130)
TRIGLYCERIDES: 131 mg/dL (ref <150)

## 2017-11-05 LAB — COMPLETE METABOLIC PANEL WITH GFR
AG Ratio: 1.7 (calc) (ref 1.0–2.5)
ALKALINE PHOSPHATASE (APISO): 103 U/L (ref 33–130)
ALT: 15 U/L (ref 6–29)
AST: 16 U/L (ref 10–35)
Albumin: 4.5 g/dL (ref 3.6–5.1)
BILIRUBIN TOTAL: 0.8 mg/dL (ref 0.2–1.2)
BUN: 15 mg/dL (ref 7–25)
CHLORIDE: 105 mmol/L (ref 98–110)
CO2: 28 mmol/L (ref 20–32)
Calcium: 9.9 mg/dL (ref 8.6–10.4)
Creat: 0.6 mg/dL (ref 0.50–1.05)
GFR, EST AFRICAN AMERICAN: 116 mL/min/{1.73_m2} (ref >=60)
GFR, Est Non African American: 100 mL/min/{1.73_m2} (ref >=60)
GLUCOSE: 87 mg/dL (ref 65–99)
Globulin: 2.6 g/dL (calc) (ref 1.9–3.7)
POTASSIUM: 4.1 mmol/L (ref 3.5–5.3)
SODIUM: 140 mmol/L (ref 135–146)
Total Protein: 7.1 g/dL (ref 6.1–8.1)

## 2017-11-05 LAB — HEPATITIS C ANTIBODY
HEP C AB: NONREACTIVE
SIGNAL TO CUT-OFF: 0.02 (ref <1.00)

## 2017-11-05 LAB — VITAMIN D 1,25 DIHYDROXY
Vitamin D 1, 25 (OH)2 Total: 63 pg/mL (ref 18–72)
Vitamin D2 1, 25 (OH)2: <8 pg/mL
Vitamin D3 1, 25 (OH)2: 63 pg/mL

## 2017-11-05 LAB — CBC
HCT: 43.9 % (ref 35.0–45.0)
Hemoglobin: 15.1 g/dL (ref 11.7–15.5)
MCH: 30 pg (ref 27.0–33.0)
MCHC: 34.4 g/dL (ref 32.0–36.0)
MCV: 87.3 fL (ref 80.0–100.0)
MPV: 12.7 fL — ABNORMAL HIGH (ref 7.5–12.5)
Platelets: 259 10*3/uL (ref 140–400)
RBC: 5.03 10*6/uL (ref 3.80–5.10)
RDW: 13.7 % (ref 11.0–15.0)
WBC: 7.8 10*3/uL (ref 3.8–10.8)

## 2017-11-05 LAB — B12 AND FOLATE PANEL
FOLATE: 15.1 ng/mL
Vitamin B-12: 1174 pg/mL — ABNORMAL HIGH (ref 200–1100)

## 2017-11-05 LAB — TSH: TSH: 0.61 mIU/L (ref 0.40–4.50)

## 2017-11-06 DIAGNOSIS — F17200 Nicotine dependence, unspecified, uncomplicated: Secondary | ICD-10-CM | POA: Insufficient documentation

## 2017-11-07 NOTE — Progress Notes (Signed)
Call pt: cholesterol is really high. LDL is 200. Are you taking crestor?  b12 is a little elevated. Are you taking any supplements?

## 2017-11-11 NOTE — Progress Notes (Signed)
Restart crestor and recheck in 4 months.

## 2017-11-24 ENCOUNTER — Other Ambulatory Visit: Payer: Self-pay | Admitting: Acute Care

## 2017-11-24 DIAGNOSIS — F17210 Nicotine dependence, cigarettes, uncomplicated: Secondary | ICD-10-CM

## 2017-11-24 DIAGNOSIS — Z122 Encounter for screening for malignant neoplasm of respiratory organs: Secondary | ICD-10-CM

## 2017-11-29 ENCOUNTER — Ambulatory Visit (INDEPENDENT_AMBULATORY_CARE_PROVIDER_SITE_OTHER): Payer: Federal, State, Local not specified - PPO | Admitting: Physician Assistant

## 2017-11-29 VITALS — BP 129/78 | HR 61

## 2017-11-29 DIAGNOSIS — I10 Essential (primary) hypertension: Secondary | ICD-10-CM | POA: Diagnosis not present

## 2017-11-29 NOTE — Progress Notes (Signed)
Pt came into clinic today for BP check. At last visit she was started on lisinopril, she has been taking her BP at home. Readings are:   5/14: 105/70 (68) 5/15: 104/70 (65) 5/18: 101/63 (65) 5/20: 101/63 (66) 6/3: 98/67 (68)  Went over reading today with PCP. No change. Pt is due for BMP since she just started lisinopril. Pt will go to lab today. No further questions.   Vitals:   11/29/17 1451  BP: 129/78  Pulse: 61

## 2017-11-29 NOTE — Progress Notes (Signed)
BP looks great. New lisinopril start. Check bmp.

## 2017-11-30 LAB — BASIC METABOLIC PANEL WITH GFR
BUN: 10 mg/dL (ref 7–25)
CALCIUM: 9.8 mg/dL (ref 8.6–10.4)
CHLORIDE: 104 mmol/L (ref 98–110)
CO2: 30 mmol/L (ref 20–32)
Creat: 0.67 mg/dL (ref 0.50–1.05)
GFR, EST AFRICAN AMERICAN: 112 mL/min/{1.73_m2} (ref >=60)
GFR, Est Non African American: 97 mL/min/{1.73_m2} (ref >=60)
Glucose, Bld: 87 mg/dL (ref 65–99)
POTASSIUM: 4.4 mmol/L (ref 3.5–5.3)
Sodium: 140 mmol/L (ref 135–146)

## 2017-11-30 NOTE — Progress Notes (Signed)
Call pt: looks great! Tolerating lisinopril well.

## 2017-12-01 ENCOUNTER — Other Ambulatory Visit: Payer: Self-pay | Admitting: Physician Assistant

## 2017-12-12 ENCOUNTER — Encounter: Payer: Self-pay | Admitting: Acute Care

## 2017-12-12 ENCOUNTER — Ambulatory Visit
Admission: RE | Admit: 2017-12-12 | Discharge: 2017-12-12 | Disposition: A | Discharge status: Home / Self Care | Payer: Federal, State, Local not specified - PPO | Source: Ambulatory Visit | Attending: Acute Care | Admitting: Acute Care

## 2017-12-12 ENCOUNTER — Ambulatory Visit (INDEPENDENT_AMBULATORY_CARE_PROVIDER_SITE_OTHER): Payer: Federal, State, Local not specified - PPO | Admitting: Acute Care

## 2017-12-12 DIAGNOSIS — F17210 Nicotine dependence, cigarettes, uncomplicated: Secondary | ICD-10-CM | POA: Diagnosis not present

## 2017-12-12 DIAGNOSIS — Z122 Encounter for screening for malignant neoplasm of respiratory organs: Secondary | ICD-10-CM

## 2017-12-12 NOTE — Progress Notes (Signed)
Shared Decision Making Visit Lung Cancer Screening Program [phone])   Eligibility:  Age 57 y.o.  Pack Years Smoking History Calculation 30 pack year smoking history (# packs/per year x # years smoked)  Recent History of coughing up blood  no  Unexplained weight loss? no ( >Than 15 pounds within the last 6 months )  Prior History Lung / other cancer no (Diagnosis within the last 5 years already requiring surveillance chest CT Scans).  Smoking Status Current Smoker  Former Smokers: Years since quit: NA  Quit Date: NA  Visit Components:  Discussion included one or more decision making aids. yes  Discussion included risk/benefits of screening. yes  Discussion included potential follow up diagnostic testing for abnormal scans. yes  Discussion included meaning and risk of over diagnosis. yes  Discussion included meaning and risk of False Positives. yes  Discussion included meaning of total radiation exposure. yes  Counseling Included:  Importance of adherence to annual lung cancer LDCT screening. yes  Impact of comorbidities on ability to participate in the program. yes  Ability and willingness to under diagnostic treatment. yes  Smoking Cessation Counseling:  Current Smokers:   Discussed importance of smoking cessation. yes  Information about tobacco cessation classes and interventions provided to patient. yes  Patient provided with "ticket" for LDCT Scan. yes  Symptomatic Patient. no  Counseling NA  Diagnosis Code: Tobacco Use Z72.0  Asymptomatic Patient yes  Counseling (Intermediate counseling: > three minutes counseling) Q2297  Former Smokers:   Discussed the importance of maintaining cigarette abstinence. yes  Diagnosis Code: Personal History of Nicotine Dependence. L89.211  Information about tobacco cessation classes and interventions provided to patient. Yes  Patient provided with "ticket" for LDCT Scan. yes  Written Order for Lung Cancer  Screening with LDCT placed in Epic. Yes (CT Chest Lung Cancer Screening Low Dose W/O CM) [id number] Z12.2-Screening of respiratory organs Z87.891-Personal history of nicotine dependence  I have spent 25 minutes of face to face time with Ms. Scovell discussing the risks and benefits of lung cancer screening. We viewed a power point together that explained in detail the above noted topics. We paused at intervals to allow for questions to be asked and answered to ensure understanding.We discussed that the single most powerful action that she can take to decrease her risk of developing lung cancer is to quit smoking. We discussed whether or not she is ready to commit to setting a quit date. She is planning on trying to quit after her birthday in 1 month with Wellbutrin. We discussed options for tools to aid in quitting smoking including nicotine replacement therapy, non-nicotine medications, support groups, Quit Smart classes, and behavior modification. We discussed that often times setting smaller, more achievable goals, such as eliminating 1 cigarette a day for a week and then 2 cigarettes a day for a week can be helpful in slowly decreasing the number of cigarettes smoked. This allows for a sense of accomplishment as well as providing a clinical benefit. I gave her  the " Be Stronger Than Your Excuses" card with contact information for community resources, classes, free nicotine replacement therapy, and access to mobile apps, text messaging, and on-line smoking cessation help. I have also given her my card and contact information in the event she needs to contact me. We discussed the time and location of the scan, and that either Doroteo Glassman RN or I will call with the results within 24-48 hours of receiving them. I have offered her  a copy  of the power point we viewed  as a resource in the event they need reinforcement of the concepts we discussed today in the office. The patient verbalized understanding of all of   the above and had no further questions upon leaving the office. They have my contact information in the event they have any further questions.  I spent 4 minutes counseling on smoking cessation and the health risks of continued tobacco abuse.  I explained to the patient that there has been a high incidence of coronary artery disease noted on these exams. I explained that this is a non-gated exam therefore degree or severity cannot be determined. This patient is currently on statin therapy. I have asked the patient to follow-up with their PCP regarding any incidental finding of coronary artery disease and management with diet or medication as their PCP  feels is clinically indicated. The patient verbalized understanding of the above and had no further questions upon completion of the visit.      Magdalen Spatz, NP 12/12/2017 2:35 PM

## 2017-12-16 ENCOUNTER — Telehealth: Payer: Self-pay | Admitting: Physician Assistant

## 2017-12-16 ENCOUNTER — Telehealth: Payer: Self-pay | Admitting: Acute Care

## 2017-12-16 DIAGNOSIS — Z122 Encounter for screening for malignant neoplasm of respiratory organs: Secondary | ICD-10-CM

## 2017-12-16 NOTE — Telephone Encounter (Signed)
Patient calls and would like a prescription for Wellbutrin for smoking cessation. Please send to pharmacy.

## 2017-12-16 NOTE — Telephone Encounter (Signed)
Called and spoke with pt letting her know the results of the LDCT.  Pt expressed understanding. At this time, pt wants to think about scheduling a consult appt with one of our pulmonologist based on the mild emphysema and COPD that was seen on the scan. Pt is requesting the results of the scan to be sent to her address on file so that way she can review our doctors and also the results of the scan and then she will call our office back when she wants to schedule an appt.  I stated to pt our consults have to be a 4min appt so we will get her scheduled when we can once she calls back.  Also stated to pt I will send results to her PCP so that way they will have the results.  Order has also been placed for the repeat LDCT 1 year from 11/2017.  Nothing further needed at this time.

## 2017-12-19 ENCOUNTER — Other Ambulatory Visit: Payer: Self-pay | Admitting: Physician Assistant

## 2017-12-19 MED ORDER — BUPROPION HCL ER (SR) 100 MG PO TB12: 100.0000 mg | ORAL_TABLET | Freq: Two times a day (BID) | ORAL | 0 refills | Status: DC

## 2017-12-19 NOTE — Progress Notes (Signed)
Pt would like wellbutrin for smoking cessation.

## 2017-12-19 NOTE — Telephone Encounter (Signed)
Sent wellbutrin start one tablet for 3 days then increase to twice a day. Start slowly tapering smoking. Goal to be stopped in one month. Follow up in office in one month. If any suicidal thoughts or major mood changes please stop and call office.

## 2017-12-19 NOTE — Telephone Encounter (Signed)
Pt advised. Verbalized understanding.

## 2018-01-01 ENCOUNTER — Other Ambulatory Visit: Payer: Self-pay | Admitting: Physician Assistant

## 2018-01-04 ENCOUNTER — Telehealth: Payer: Self-pay

## 2018-01-04 ENCOUNTER — Other Ambulatory Visit: Payer: Self-pay

## 2018-01-04 DIAGNOSIS — I10 Essential (primary) hypertension: Secondary | ICD-10-CM

## 2018-01-04 MED ORDER — LISINOPRIL 10 MG PO TABS: ORAL_TABLET | ORAL | 1 refills | Status: DC

## 2018-01-05 NOTE — Telephone Encounter (Signed)
No additional encounter notes found. -[place]

## 2018-01-13 ENCOUNTER — Ambulatory Visit: Payer: Federal, State, Local not specified - PPO

## 2018-03-07 DIAGNOSIS — M533 Sacrococcygeal disorders, not elsewhere classified: Secondary | ICD-10-CM | POA: Diagnosis not present

## 2018-03-07 DIAGNOSIS — G5603 Carpal tunnel syndrome, bilateral upper limbs: Secondary | ICD-10-CM | POA: Diagnosis not present

## 2018-03-07 DIAGNOSIS — M961 Postlaminectomy syndrome, not elsewhere classified: Secondary | ICD-10-CM | POA: Diagnosis not present

## 2018-03-23 DIAGNOSIS — M533 Sacrococcygeal disorders, not elsewhere classified: Secondary | ICD-10-CM | POA: Diagnosis not present

## 2018-06-05 DIAGNOSIS — M961 Postlaminectomy syndrome, not elsewhere classified: Secondary | ICD-10-CM | POA: Diagnosis not present

## 2018-06-05 DIAGNOSIS — M5416 Radiculopathy, lumbar region: Secondary | ICD-10-CM | POA: Diagnosis not present

## 2018-06-05 DIAGNOSIS — M5412 Radiculopathy, cervical region: Secondary | ICD-10-CM | POA: Diagnosis not present

## 2018-06-05 DIAGNOSIS — M533 Sacrococcygeal disorders, not elsewhere classified: Secondary | ICD-10-CM | POA: Diagnosis not present

## 2018-06-20 ENCOUNTER — Other Ambulatory Visit: Payer: Self-pay | Admitting: Physician Assistant

## 2018-06-20 DIAGNOSIS — I10 Essential (primary) hypertension: Secondary | ICD-10-CM

## 2018-08-28 DIAGNOSIS — M5416 Radiculopathy, lumbar region: Secondary | ICD-10-CM | POA: Diagnosis not present

## 2018-08-28 DIAGNOSIS — M5412 Radiculopathy, cervical region: Secondary | ICD-10-CM | POA: Diagnosis not present

## 2018-08-28 DIAGNOSIS — M533 Sacrococcygeal disorders, not elsewhere classified: Secondary | ICD-10-CM | POA: Diagnosis not present

## 2018-08-28 DIAGNOSIS — G894 Chronic pain syndrome: Secondary | ICD-10-CM | POA: Diagnosis not present

## 2018-10-20 ENCOUNTER — Other Ambulatory Visit: Payer: Self-pay | Admitting: Physician Assistant

## 2018-10-20 DIAGNOSIS — I10 Essential (primary) hypertension: Secondary | ICD-10-CM

## 2018-10-25 ENCOUNTER — Encounter: Payer: Self-pay | Admitting: Physician Assistant

## 2018-10-25 ENCOUNTER — Ambulatory Visit (INDEPENDENT_AMBULATORY_CARE_PROVIDER_SITE_OTHER): Payer: Federal, State, Local not specified - PPO | Admitting: Physician Assistant

## 2018-10-25 VITALS — BP 176/120 | HR 62 | Temp 96.6°F | Ht 70.0 in | Wt 180.0 lb

## 2018-10-25 DIAGNOSIS — K2100 Gastro-esophageal reflux disease with esophagitis, without bleeding: Secondary | ICD-10-CM

## 2018-10-25 DIAGNOSIS — M5416 Radiculopathy, lumbar region: Secondary | ICD-10-CM | POA: Diagnosis not present

## 2018-10-25 DIAGNOSIS — R790 Abnormal level of blood mineral: Secondary | ICD-10-CM

## 2018-10-25 DIAGNOSIS — M5126 Other intervertebral disc displacement, lumbar region: Secondary | ICD-10-CM

## 2018-10-25 DIAGNOSIS — E782 Mixed hyperlipidemia: Secondary | ICD-10-CM | POA: Diagnosis not present

## 2018-10-25 DIAGNOSIS — I10 Essential (primary) hypertension: Secondary | ICD-10-CM

## 2018-10-25 DIAGNOSIS — G43009 Migraine without aura, not intractable, without status migrainosus: Secondary | ICD-10-CM

## 2018-10-25 DIAGNOSIS — Z79899 Other long term (current) drug therapy: Secondary | ICD-10-CM

## 2018-10-25 MED ORDER — ROSUVASTATIN CALCIUM 40 MG PO TABS: 40.0000 mg | ORAL_TABLET | Freq: Every day | ORAL | 3 refills | Status: DC

## 2018-10-25 MED ORDER — CELECOXIB 200 MG PO CAPS: 200.0000 mg | ORAL_CAPSULE | Freq: Two times a day (BID) | ORAL | 3 refills | Status: DC

## 2018-10-25 MED ORDER — PROMETHAZINE HCL 25 MG PO TABS: 25.0000 mg | ORAL_TABLET | ORAL | 2 refills | Status: DC | PRN

## 2018-10-25 MED ORDER — ESOMEPRAZOLE MAGNESIUM 40 MG PO CPDR: DELAYED_RELEASE_CAPSULE | ORAL | 3 refills | Status: DC

## 2018-10-25 MED ORDER — LISINOPRIL 10 MG PO TABS: ORAL_TABLET | ORAL | 1 refills | Status: DC

## 2018-10-25 MED ORDER — AMITRIPTYLINE HCL 50 MG PO TABS: ORAL_TABLET | ORAL | 3 refills | Status: DC

## 2018-10-25 MED ORDER — PREGABALIN 150 MG PO CAPS: 300.0000 mg | ORAL_CAPSULE | Freq: Two times a day (BID) | ORAL | 3 refills | Status: DC

## 2018-10-25 NOTE — Progress Notes (Deleted)
Patient has been off lisinopril for 3 weeks due to being out of medication. BP today 176/120. It was measuring well prior to stopping medication. She needs refills. Also requesting refills of phenergan. No other complaints.

## 2018-10-30 DIAGNOSIS — I10 Essential (primary) hypertension: Secondary | ICD-10-CM | POA: Insufficient documentation

## 2018-10-30 NOTE — Progress Notes (Signed)
Patient ID: NEETU CARROZZA, female   DOB: November 27, 1960, 58 y.o.   MRN: [id number] .Marland KitchenVirtual Visit via Video Note  I connected with Inocente Salles on 10/30/18 at  8:10 AM EDT by a video enabled telemedicine application and verified that I am speaking with the correct person using two identifiers.  Location: Patient: home Provider: home   I discussed the limitations of evaluation and management by telemedicine and the availability of in person appointments. The patient expressed understanding and agreed to proceed.  History of Present Illness: Pt is a 58 yo female with HTN, chronic back pain due to lumbar disc herniation, migrines, low iron stores who calls in for refills today.   She admits she has been out of lisinopril for 3 weeks. BP has been terrible since stopping medication. She was checking BP before and ranging below 130/90. No CP, palpitations, headaches, or vision changes.   GERD controlled with medication. No problems or concerns.   Migraines very controlled. Needs phenergan for prn usage.   Pt sees pain clinic for oxycodone. She continues to use elavil, lyrica, and celebrex for back pain and needs refills.                     .. Active Ambulatory Problems    Diagnosis Date Noted  . Hyperlipidemia 09/15/2007  . Esophageal reflux 09/15/2007  . IRRITABLE BOWEL SYNDROME, HX OF 09/15/2007  . Hematemesis 11/04/2012  . Syncope 11/04/2012  . Stricture and stenosis of esophagus 11/04/2012  . Lumbar disc herniation 10/08/2015  . Migraine without aura and with status migrainosus, not intractable 10/08/2015  . Primary osteoarthritis involving multiple joints 10/11/2015  . Lumbar radiculopathy   . Hematuria 04/23/2016  . Low iron stores 04/25/2016  . Elevated liver enzymes 04/25/2016  . Current smoker 11/06/2017  . Essential hypertension 10/30/2018   Resolved Ambulatory Problems    Diagnosis Date Noted  . No Resolved Ambulatory Problems   Past Medical History:  Diagnosis Date   . Allergy   . Anemia   . Anxiety   . Arthritis   . Chronic headaches   . Colon polyps   . Esophagitis 1992  . GERD (gastroesophageal reflux disease)   . HLD (hyperlipidemia)   . HTN (hypertension)   . IBS (irritable bowel syndrome)   . Sliding hiatal hernia 1992  . Stricture of esophagus 1992  . Ulcer    Reviewed med, allergy, problem list.    Observations/Objective: No acute distress. Normal appearance.  Normal breathing.  Normal mood.   .. Today's Vitals   10/25/18 0742  BP: (!) 176/120  Pulse: 62  Temp: (!) 96.6 F (35.9 C)  TempSrc: Oral  Weight: 180 lb (81.6 kg)  Height: 5\' 10"  (1.778 m)   Body mass index is 25.83 kg/m.   Assessment and Plan: Marland KitchenMarland KitchenShaneal was seen today for hypertension.  Diagnoses and all orders for this visit:  Essential hypertension -     lisinopril (ZESTRIL) 10 MG tablet; TAKE 1 TABLET BY MOUTH DAILY -     COMPLETE METABOLIC PANEL WITH GFR  Mixed hyperlipidemia -     rosuvastatin (CRESTOR) 40 MG tablet; Take 1 tablet (40 mg total) by mouth daily. -     Lipid Panel w/reflex Direct LDL  Lumbar disc herniation -     celecoxib (CELEBREX) 200 MG capsule; Take 1 capsule (200 mg total) by mouth 2 (two) times daily. -     amitriptyline (ELAVIL) 50 MG tablet; TAKE 1 TABLET(50 MG)  BY MOUTH AT BEDTIME -     pregabalin (LYRICA) 150 MG capsule; Take 2 capsules (300 mg total) by mouth 2 (two) times a day.  Lumbar radiculopathy -     celecoxib (CELEBREX) 200 MG capsule; Take 1 capsule (200 mg total) by mouth 2 (two) times daily. -     amitriptyline (ELAVIL) 50 MG tablet; TAKE 1 TABLET(50 MG) BY MOUTH AT BEDTIME -     pregabalin (LYRICA) 150 MG capsule; Take 2 capsules (300 mg total) by mouth 2 (two) times a day.  Gastroesophageal reflux disease with esophagitis -     esomeprazole (NEXIUM) 40 MG capsule; TAKE ONE CAPSULE BY MOUTH TWICE DAILY BEFORE A MEAL  Medication management -     Lipid Panel w/reflex Direct LDL -     COMPLETE METABOLIC  PANEL WITH GFR -     CBC with Differential/Platelet -     Ferritin  Low iron stores -     CBC with Differential/Platelet -     Ferritin  Migraine without aura and without status migrainosus, not intractable -     promethazine (PHENERGAN) 25 MG tablet; Take 1 tablet (25 mg total) by mouth as needed for nausea or vomiting.   Refilled medications.  Keep BP log and report back readings in 2 weeks. Very dangerous for BP to get that elevated.  In need for blood work. Ordered today.    Follow Up Instructions:    I discussed the assessment and treatment plan with the patient. The patient was provided an opportunity to ask questions and all were answered. The patient agreed with the plan and demonstrated an understanding of the instructions.   The patient was advised to call back or seek an in-person evaluation if the symptoms worsen or if the condition fails to improve as anticipated.  I provided 25 minutes of non-face-to-face time during this encounter.   Iran Planas, PA-C

## 2018-11-27 DIAGNOSIS — M5416 Radiculopathy, lumbar region: Secondary | ICD-10-CM | POA: Diagnosis not present

## 2018-11-27 DIAGNOSIS — M5412 Radiculopathy, cervical region: Secondary | ICD-10-CM | POA: Diagnosis not present

## 2018-12-20 ENCOUNTER — Other Ambulatory Visit: Payer: Self-pay

## 2018-12-20 ENCOUNTER — Other Ambulatory Visit: Payer: Self-pay | Admitting: Physician Assistant

## 2018-12-20 ENCOUNTER — Ambulatory Visit: Payer: Federal, State, Local not specified - PPO

## 2018-12-20 DIAGNOSIS — F17210 Nicotine dependence, cigarettes, uncomplicated: Secondary | ICD-10-CM | POA: Diagnosis not present

## 2018-12-20 DIAGNOSIS — Z122 Encounter for screening for malignant neoplasm of respiratory organs: Secondary | ICD-10-CM

## 2018-12-20 DIAGNOSIS — Z1231 Encounter for screening mammogram for malignant neoplasm of breast: Secondary | ICD-10-CM

## 2018-12-26 ENCOUNTER — Telehealth: Payer: Self-pay | Admitting: Acute Care

## 2018-12-26 DIAGNOSIS — F17210 Nicotine dependence, cigarettes, uncomplicated: Secondary | ICD-10-CM

## 2018-12-26 DIAGNOSIS — Z122 Encounter for screening for malignant neoplasm of respiratory organs: Secondary | ICD-10-CM

## 2018-12-26 NOTE — Telephone Encounter (Signed)
Pt informed of CT results per Sarah Groce, NP.  PT verbalized understanding.  Copy sent to PCP.  Order placed for 1 yr f/u CT.  

## 2018-12-27 ENCOUNTER — Telehealth: Payer: Self-pay | Admitting: Neurology

## 2018-12-27 ENCOUNTER — Encounter: Payer: Self-pay | Admitting: Physician Assistant

## 2018-12-27 DIAGNOSIS — J439 Emphysema, unspecified: Secondary | ICD-10-CM | POA: Insufficient documentation

## 2018-12-27 NOTE — Telephone Encounter (Signed)
Author: Donella Stade, PA-C Service: - Author Type: Physician Assistant  Filed: 12/27/2018 7:37 AM Encounter Date: 12/26/2018 Status: Signed  Editor: Lavada Mesi (Physician Assistant)     Let pt know got CT screening for lung cancer results. No alarming features. Repeat in 1 year. Did show some plaque accumulation. You should be on crestor. I see labs pending. Encourage to get those when you can. Some emphysema seen in lungs. Please follow up as needed for any cough/sob etc.      Patient made aware of results.

## 2018-12-27 NOTE — Telephone Encounter (Signed)
Let pt know got CT screening for lung cancer results. No alarming features. Repeat in 1 year. Did show some plaque accumulation. You should be on crestor. I see labs pending. Encourage to get those when you can. Some emphysema seen in lungs. Please follow up as needed for any cough/sob etc.

## 2018-12-27 NOTE — Telephone Encounter (Signed)
-----   Message from Donella Stade, [place] sent at 12/27/2018  7:37 AM EDT -----   ----- Message ----- From: Christie Beckers, RN Sent: 12/26/2018   3:00 PM EDT To: Donella Stade, PA-C

## 2019-01-17 ENCOUNTER — Other Ambulatory Visit: Payer: Self-pay

## 2019-01-17 ENCOUNTER — Ambulatory Visit (INDEPENDENT_AMBULATORY_CARE_PROVIDER_SITE_OTHER): Payer: Federal, State, Local not specified - PPO

## 2019-01-17 DIAGNOSIS — Z79899 Other long term (current) drug therapy: Secondary | ICD-10-CM | POA: Diagnosis not present

## 2019-01-17 DIAGNOSIS — I10 Essential (primary) hypertension: Secondary | ICD-10-CM | POA: Diagnosis not present

## 2019-01-17 DIAGNOSIS — Z1231 Encounter for screening mammogram for malignant neoplasm of breast: Secondary | ICD-10-CM

## 2019-01-17 DIAGNOSIS — E782 Mixed hyperlipidemia: Secondary | ICD-10-CM | POA: Diagnosis not present

## 2019-01-17 DIAGNOSIS — R790 Abnormal level of blood mineral: Secondary | ICD-10-CM | POA: Diagnosis not present

## 2019-01-17 NOTE — Progress Notes (Signed)
Normal mammogram. Follow up in one year.

## 2019-01-18 ENCOUNTER — Other Ambulatory Visit: Payer: Self-pay | Admitting: Neurology

## 2019-01-18 ENCOUNTER — Ambulatory Visit: Payer: Federal, State, Local not specified - PPO

## 2019-01-18 DIAGNOSIS — M961 Postlaminectomy syndrome, not elsewhere classified: Secondary | ICD-10-CM | POA: Diagnosis not present

## 2019-01-18 DIAGNOSIS — M5416 Radiculopathy, lumbar region: Secondary | ICD-10-CM | POA: Diagnosis not present

## 2019-01-18 DIAGNOSIS — M5412 Radiculopathy, cervical region: Secondary | ICD-10-CM | POA: Diagnosis not present

## 2019-01-18 DIAGNOSIS — M533 Sacrococcygeal disorders, not elsewhere classified: Secondary | ICD-10-CM | POA: Diagnosis not present

## 2019-01-18 DIAGNOSIS — I10 Essential (primary) hypertension: Secondary | ICD-10-CM

## 2019-01-18 MED ORDER — LISINOPRIL 10 MG PO TABS: ORAL_TABLET | ORAL | 1 refills | Status: DC

## 2019-01-18 NOTE — Progress Notes (Signed)
WOW such great results. TC down from 304 to 172 and LDL down 200 to 87.  Cbc and iron stores look good.  Kidney and liver look good.  Glucose fasting a little elevated. Please add a1c to evaluate for diabetes.   Do you need any refills?

## 2019-01-19 ENCOUNTER — Encounter: Payer: Self-pay | Admitting: Physician Assistant

## 2019-01-19 DIAGNOSIS — R7303 Prediabetes: Secondary | ICD-10-CM | POA: Insufficient documentation

## 2019-01-19 LAB — LIPID PANEL W/REFLEX DIRECT LDL
Cholesterol: 172 mg/dL (ref <200)
HDL: 67 mg/dL (ref >=50)
LDL Cholesterol (Calc): 87 mg/dL (calc)
Non-HDL Cholesterol (Calc): 105 mg/dL (calc) (ref <130)
Total CHOL/HDL Ratio: 2.6 (calc) (ref <5.0)
Triglycerides: 86 mg/dL (ref <150)

## 2019-01-19 LAB — CBC WITH DIFFERENTIAL/PLATELET
Absolute Monocytes: 583 cells/uL (ref 200–950)
Basophils Absolute: 48 cells/uL (ref 0–200)
Basophils Relative: 0.9 %
Eosinophils Absolute: 122 cells/uL (ref 15–500)
Eosinophils Relative: 2.3 %
HCT: 42.4 % (ref 35.0–45.0)
Hemoglobin: 14.1 g/dL (ref 11.7–15.5)
Lymphs Abs: 2242 cells/uL (ref 850–3900)
MCH: 29.9 pg (ref 27.0–33.0)
MCHC: 33.3 g/dL (ref 32.0–36.0)
MCV: 89.8 fL (ref 80.0–100.0)
MPV: 12.8 fL — ABNORMAL HIGH (ref 7.5–12.5)
Monocytes Relative: 11 %
Neutro Abs: 2306 cells/uL (ref 1500–7800)
Neutrophils Relative %: 43.5 %
Platelets: 203 10*3/uL (ref 140–400)
RBC: 4.72 10*6/uL (ref 3.80–5.10)
RDW: 13.8 % (ref 11.0–15.0)
Total Lymphocyte: 42.3 %
WBC: 5.3 10*3/uL (ref 3.8–10.8)

## 2019-01-19 LAB — FERRITIN: Ferritin: 95 ng/mL (ref 16–232)

## 2019-01-19 LAB — COMPLETE METABOLIC PANEL WITH GFR
AG Ratio: 2.2 (calc) (ref 1.0–2.5)
ALT: 19 U/L (ref 6–29)
AST: 21 U/L (ref 10–35)
Albumin: 4.7 g/dL (ref 3.6–5.1)
Alkaline phosphatase (APISO): 82 U/L (ref 37–153)
BUN: 13 mg/dL (ref 7–25)
CO2: 28 mmol/L (ref 20–32)
Calcium: 9.9 mg/dL (ref 8.6–10.4)
Chloride: 103 mmol/L (ref 98–110)
Creat: 0.69 mg/dL (ref 0.50–0.99)
GFR, Est African American: 110 mL/min/{1.73_m2} (ref >=60)
GFR, Est Non African American: 95 mL/min/{1.73_m2} (ref >=60)
Globulin: 2.1 g/dL (calc) (ref 1.9–3.7)
Glucose, Bld: 108 mg/dL — ABNORMAL HIGH (ref 65–99)
Potassium: 4.2 mmol/L (ref 3.5–5.3)
Sodium: 141 mmol/L (ref 135–146)
Total Bilirubin: 0.7 mg/dL (ref 0.2–1.2)
Total Protein: 6.8 g/dL (ref 6.1–8.1)

## 2019-01-19 LAB — HEMOGLOBIN A1C W/OUT EAG: Hgb A1c MFr Bld: 6.4 % of total Hgb — ABNORMAL HIGH (ref <5.7)

## 2019-01-19 NOTE — Progress Notes (Signed)
Call pt: A!C went up to 6.4. at 6.5 diabetes is the diagnoses. We really need to start metformin at this time and make sure diet is limited in sugars and carbs. You are still technically in the pre-diabetes range. I don't mind scheduling a virtual conversation about future management. Your last BP was not good in office. Can you get me some home readings to make sure we are back on track?

## 2019-03-19 DIAGNOSIS — M533 Sacrococcygeal disorders, not elsewhere classified: Secondary | ICD-10-CM | POA: Diagnosis not present

## 2019-03-19 DIAGNOSIS — M961 Postlaminectomy syndrome, not elsewhere classified: Secondary | ICD-10-CM | POA: Diagnosis not present

## 2019-03-19 DIAGNOSIS — G894 Chronic pain syndrome: Secondary | ICD-10-CM | POA: Diagnosis not present

## 2019-03-23 ENCOUNTER — Other Ambulatory Visit: Payer: Self-pay

## 2019-03-23 ENCOUNTER — Other Ambulatory Visit: Payer: Self-pay | Admitting: Family Medicine

## 2019-03-23 ENCOUNTER — Ambulatory Visit (INDEPENDENT_AMBULATORY_CARE_PROVIDER_SITE_OTHER): Payer: Federal, State, Local not specified - PPO | Admitting: Family Medicine

## 2019-03-23 ENCOUNTER — Telehealth: Payer: Self-pay | Admitting: Neurology

## 2019-03-23 DIAGNOSIS — R05 Cough: Secondary | ICD-10-CM

## 2019-03-23 DIAGNOSIS — R058 Other specified cough: Secondary | ICD-10-CM

## 2019-03-23 MED ORDER — BENZONATATE 200 MG PO CAPS: 200.0000 mg | ORAL_CAPSULE | Freq: Three times a day (TID) | ORAL | 0 refills | Status: DC | PRN

## 2019-03-23 MED ORDER — ALBUTEROL SULFATE HFA 108 (90 BASE) MCG/ACT IN AERS: 2.0000 | INHALATION_SPRAY | Freq: Four times a day (QID) | RESPIRATORY_TRACT | 1 refills | Status: DC | PRN

## 2019-03-23 NOTE — Telephone Encounter (Signed)
Appointment has been made. No further questions at this time.  

## 2019-03-23 NOTE — Telephone Encounter (Signed)
Patient left vm asking for cough medication. She has had a cold x 2 weeks and has ongoing cough keeping her up at night. No documentation that she has been seen for this in our office.   Janett Billow - please call and offer virtual visit with any provider to discuss cold symptoms/cough medication. [phone].Thanks!

## 2019-03-23 NOTE — Progress Notes (Signed)
Virtual Visit  via Video Note  I connected with      Monica Brooks by a video enabled telemedicine application and verified that I am speaking with the correct person using two identifiers.   I discussed the limitations of evaluation and management by telemedicine and the availability of in person appointments. The patient expressed understanding and agreed to proceed.  History of Present Illness: Monica Brooks is a 58 y.o. female who would like to discuss cough and cold.   Starting about 3 weeks ago patient became sick with congestion and stuffiness and runny nose and cough.  Her sister had a similar episode.  She got better about a week ago but the cough is still persistent.  She is tried over-the-counter TheraFlu, NyQuil, and Robitussin-DM.  She notes this has not helped the cough very much.  The cough is interfering with her sleep.  She denies any trouble breathing or wheezing.  She denies shortness of breath.  She denies muscle aches or pains.  Overall she feels pretty well.  Her sister had a negative COVID-19 test.  She has not had a COVID-19 test.      Observations/Objective: There were no vitals taken for this visit. Wt Readings from Last 5 Encounters:  10/25/18 180 lb (81.6 kg)  11/01/17 173 lb (78.5 kg)  05/13/17 177 lb (80.3 kg)  04/23/16 181 lb (82.1 kg)  10/08/15 166 lb (75.3 kg)   Exam: Appearance nontoxic appearing Normal Speech.  No shortness of breath or wheezing or tachypnea.  Able to complete sentences.    Assessment and Plan: 58 y.o. female with cough.  Likely postviral.  Patient does have history of emphysema when seen on CT scan earlier this year.  She does not have symptoms suggestive of COPD exacerbation however.  Plan to continue over-the-counter medication.  We will also use Tessalon Perles and trial of albuterol inhaler.  If not improving reasonable to use opiates based cough syrup.  However she already is taking oxycodone provided by pain management.   It is unlikely that she can get much further benefit with hydrocodone or codeine cough syrup.  PDMP reviewed during this encounter. No orders of the defined types were placed in this encounter.  Meds ordered this encounter  Medications  . benzonatate (TESSALON) 200 MG capsule    Sig: Take 1 capsule (200 mg total) by mouth 3 (three) times daily as needed for cough.    Dispense:  45 capsule    Refill:  0  . albuterol (VENTOLIN HFA) 108 (90 Base) MCG/ACT inhaler    Sig: Inhale 2 puffs into the lungs every 6 (six) hours as needed for wheezing or shortness of breath (or cough).    Dispense:  18 g    Refill:  1    Follow Up Instructions:    I discussed the assessment and treatment plan with the patient. The patient was provided an opportunity to ask questions and all were answered. The patient agreed with the plan and demonstrated an understanding of the instructions.   The patient was advised to call back or seek an in-person evaluation if the symptoms worsen or if the condition fails to improve as anticipated.  Time: 15 minutes of intraservice time, with >22 minutes of total time during today's visit.      Historical information moved to improve visibility of documentation.  Past Medical History:  Diagnosis Date  . Allergy   . Anemia   . Anxiety   .  Arthritis   . Chronic headaches   . Colon polyps   . Esophagitis 1992   EGD---Dr. Velora Heckler   . GERD (gastroesophageal reflux disease)   . HLD (hyperlipidemia)   . HTN (hypertension)    no meds  . IBS (irritable bowel syndrome)   . Sliding hiatal hernia 1992   EGD---Dr. Velora Heckler   . Stricture of esophagus 1992   EGD---Dr. Velora Heckler   . Ulcer    Past Surgical History:  Procedure Laterality Date  . ABDOMINAL HYSTERECTOMY    . ANKLE FRACTURE SURGERY Left   . BREAST CYST EXCISION Right   . BUNIONECTOMY Left    with tendon release  . KNEE ARTHROSCOPY Left    Social History   Tobacco Use  . Smoking status: Current Every  Day Smoker    Packs/day: 0.80    Years: 38.00    Pack years: 30.40    Types: Cigarettes  . Smokeless tobacco: Never Used  Substance Use Topics  . Alcohol use: No   family history includes Breast cancer in her mother; Colon cancer in her maternal grandmother; Colon polyps in her father; Diabetes in her maternal aunt, maternal grandmother, and sister; Hyperlipidemia in her sister; Hypertension in her father, mother, and sister; Prostate cancer in her father; Stroke in her father.  Medications: Current Outpatient Medications  Medication Sig Dispense Refill  . amitriptyline (ELAVIL) 50 MG tablet TAKE 1 TABLET(50 MG) BY MOUTH AT BEDTIME 90 tablet 3  . celecoxib (CELEBREX) 200 MG capsule Take 1 capsule (200 mg total) by mouth 2 (two) times daily. 180 capsule 3  . cyclobenzaprine (FLEXERIL) 10 MG tablet Take 10 mg 3 (three) times daily as needed by mouth.  5  . diclofenac sodium (VOLTAREN) 1 % GEL Apply topically.    Marland Kitchen esomeprazole (NEXIUM) 40 MG capsule TAKE ONE CAPSULE BY MOUTH TWICE DAILY BEFORE A MEAL 180 capsule 3  . ibuprofen (ADVIL,MOTRIN) 800 MG tablet Take 1 tablet (800 mg total) by mouth every 8 (eight) hours as needed. 60 tablet 1  . lisinopril (ZESTRIL) 10 MG tablet TAKE 1 TABLET BY MOUTH DAILY 90 tablet 1  . naloxone (NARCAN) nasal spray 4 mg/0.1 mL Place 4 mg into the nose.    . oxyCODONE-acetaminophen (PERCOCET) 10-325 MG tablet Take 1 tablet by mouth as needed for pain.    . pregabalin (LYRICA) 150 MG capsule Take 2 capsules (300 mg total) by mouth 2 (two) times a day. 180 capsule 3  . promethazine (PHENERGAN) 25 MG tablet Take 1 tablet (25 mg total) by mouth as needed for nausea or vomiting. 30 tablet 2  . rosuvastatin (CRESTOR) 40 MG tablet Take 1 tablet (40 mg total) by mouth daily. 90 tablet 3  . tiZANidine (ZANAFLEX) 4 MG capsule Take 4 mg by mouth as needed for muscle spasms.    Marland Kitchen albuterol (VENTOLIN HFA) 108 (90 Base) MCG/ACT inhaler Inhale 2 puffs into the lungs every 6  (six) hours as needed for wheezing or shortness of breath (or cough). 18 g 1  . benzonatate (TESSALON) 200 MG capsule Take 1 capsule (200 mg total) by mouth 3 (three) times daily as needed for cough. 45 capsule 0   No current facility-administered medications for this visit.    Allergies  Allergen Reactions  . Aspirin Nausea Only    Upset stomach  . Amoxicillin-Pot Clavulanate Rash  . Sulfa Antibiotics Itching and Rash

## 2019-05-14 DIAGNOSIS — G894 Chronic pain syndrome: Secondary | ICD-10-CM | POA: Diagnosis not present

## 2019-05-14 DIAGNOSIS — M961 Postlaminectomy syndrome, not elsewhere classified: Secondary | ICD-10-CM | POA: Diagnosis not present

## 2019-05-14 DIAGNOSIS — M533 Sacrococcygeal disorders, not elsewhere classified: Secondary | ICD-10-CM | POA: Diagnosis not present

## 2019-05-16 ENCOUNTER — Other Ambulatory Visit: Payer: Self-pay | Admitting: Physician Assistant

## 2019-05-16 DIAGNOSIS — I10 Essential (primary) hypertension: Secondary | ICD-10-CM

## 2019-06-28 ENCOUNTER — Other Ambulatory Visit: Payer: Self-pay

## 2019-06-28 ENCOUNTER — Ambulatory Visit (INDEPENDENT_AMBULATORY_CARE_PROVIDER_SITE_OTHER): Payer: Federal, State, Local not specified - PPO | Admitting: Physician Assistant

## 2019-06-28 ENCOUNTER — Encounter: Payer: Self-pay | Admitting: Physician Assistant

## 2019-06-28 ENCOUNTER — Ambulatory Visit (INDEPENDENT_AMBULATORY_CARE_PROVIDER_SITE_OTHER): Payer: Federal, State, Local not specified - PPO

## 2019-06-28 VITALS — BP 155/98 | HR 79 | Temp 99.0°F | Wt 184.0 lb

## 2019-06-28 DIAGNOSIS — Z8739 Personal history of other diseases of the musculoskeletal system and connective tissue: Secondary | ICD-10-CM

## 2019-06-28 DIAGNOSIS — M961 Postlaminectomy syndrome, not elsewhere classified: Secondary | ICD-10-CM | POA: Diagnosis not present

## 2019-06-28 DIAGNOSIS — Z8669 Personal history of other diseases of the nervous system and sense organs: Secondary | ICD-10-CM | POA: Diagnosis not present

## 2019-06-28 DIAGNOSIS — Z23 Encounter for immunization: Secondary | ICD-10-CM

## 2019-06-28 DIAGNOSIS — R1905 Periumbilic swelling, mass or lump: Secondary | ICD-10-CM

## 2019-06-28 DIAGNOSIS — M545 Low back pain, unspecified: Secondary | ICD-10-CM

## 2019-06-28 DIAGNOSIS — M533 Sacrococcygeal disorders, not elsewhere classified: Secondary | ICD-10-CM

## 2019-06-28 MED ORDER — KETOROLAC TROMETHAMINE 60 MG/2ML IM SOLN
60.0000 mg | Freq: Once | INTRAMUSCULAR | Status: AC
  Administered 2019-06-28: 60 mg via INTRAMUSCULAR

## 2019-06-28 MED ORDER — PREDNISONE 50 MG PO TABS: ORAL_TABLET | ORAL | 0 refills | Status: DC

## 2019-06-28 NOTE — Progress Notes (Signed)
Subjective:    Patient ID: Monica Brooks, female    DOB: [birth year], 58 y.o.   MRN: [id number]  HPI  Pt is a 58 yo female with long hx of chronic low back pain and post laminectomy syndrome that has been well controlled until recently.  Pt has also noticed a mass around her belly button that is getting bigger. No significant discomfort. Hx of umbilical hernia. She does have some constipation. No problems eating. No blood in stool. She would like evaluated.   Pt has hx of chronic back pain and laminectomy 3 years ago. Recently her low back has been hurting more. No radicular symptoms, saddle anesthesia, bowel or bladder dysfunction. She is taking celebrex and ibuprofen alternating. This does help some. No known injury. No weakness in extremity.    .. Active Ambulatory Problems    Diagnosis Date Noted  . Hyperlipidemia 09/15/2007  . Esophageal reflux 09/15/2007  . IRRITABLE BOWEL SYNDROME, HX OF 09/15/2007  . Hematemesis 11/04/2012  . Syncope 11/04/2012  . Stricture and stenosis of esophagus 11/04/2012  . Lumbar disc herniation 10/08/2015  . Migraine without aura and with status migrainosus, not intractable 10/08/2015  . Primary osteoarthritis involving multiple joints 10/11/2015  . Lumbar radiculopathy   . Hematuria 04/23/2016  . Low iron stores 04/25/2016  . Elevated liver enzymes 04/25/2016  . Current smoker 11/06/2017  . Essential hypertension 10/30/2018  . Emphysema lung ([place]) 12/27/2018  . Pre-diabetes 01/19/2019  . Periumbilical mass 99991111  . DDD (degenerative disc disease), lumbar 07/02/2019  . Facet arthropathy 07/02/2019  . Acute right-sided low back pain without sciatica 07/02/2019  . Post laminectomy syndrome 07/02/2019  . Sacroiliac joint pain 07/02/2019   Resolved Ambulatory Problems    Diagnosis Date Noted  . No Resolved Ambulatory Problems   Past Medical History:  Diagnosis Date  . Allergy   . Anemia   . Anxiety   . Arthritis   . Chronic headaches    . Colon polyps   . Esophagitis 1992  . GERD (gastroesophageal reflux disease)   . HLD (hyperlipidemia)   . HTN (hypertension)   . IBS (irritable bowel syndrome)   . Sliding hiatal hernia 1992  . Stricture of esophagus 1992  . Ulcer     Review of Systems See hPI.     Objective:   Physical Exam Constitutional:      Appearance: Normal appearance.  HENT:     Head: Normocephalic.  Cardiovascular:     Rate and Rhythm: Normal rate and regular rhythm.  Abdominal:     Comments: 4 by 3cm reducible periumical to the right mass soft and not tender.   Musculoskeletal:     Comments: No tenderness over lumbar spine to palpation.  Negative straight leg test.  Normal strength 5/5 bilateral lower ext.  Tenderness over right low back/SI joint area.   Neurological:     General: No focal deficit present.     Mental Status: She is alert and oriented to person, place, and time.  Psychiatric:        Mood and Affect: Mood normal.           Assessment & Plan:  Marland KitchenMarland KitchenDiagnoses and all orders for this visit:  Acute right-sided low back pain without sciatica -     DG Lumbar Spine Complete -     predniSONE (DELTASONE) 50 MG tablet; One tab PO daily for 5 days. -     ketorolac (TORADOL) injection 60 mg  Periumbilical mass -  US Abdomen Complete  Need for shingles vaccine -     Varicella-zoster vaccine IM (Shingrix)  History of radicular syndrome of lower limb  Post laminectomy syndrome  Sacroiliac joint pain   Repeat xray. Likely some flare of discogenic pain into lumbar muscles via lumbar DDD. Prednisone burst, muscle relaxer, toradol in office today, exercises discuss, warm compresses, icy hot patches, tens unit. Follow up with sports medicine as needed. Consider a massage.   Mass appears like hernia. Has small umbilical hernia but the new mass is 6oclock to 12 oclock. Will get abdominal ultrasound to take a better look. Discussed likely no intervention is needed unless pt elects  for surgery.

## 2019-06-28 NOTE — Patient Instructions (Signed)

## 2019-07-02 ENCOUNTER — Encounter: Payer: Self-pay | Admitting: Physician Assistant

## 2019-07-02 DIAGNOSIS — M533 Sacrococcygeal disorders, not elsewhere classified: Secondary | ICD-10-CM | POA: Insufficient documentation

## 2019-07-02 DIAGNOSIS — M545 Low back pain, unspecified: Secondary | ICD-10-CM | POA: Insufficient documentation

## 2019-07-02 DIAGNOSIS — M5136 Other intervertebral disc degeneration, lumbar region: Secondary | ICD-10-CM | POA: Insufficient documentation

## 2019-07-02 DIAGNOSIS — M47819 Spondylosis without myelopathy or radiculopathy, site unspecified: Secondary | ICD-10-CM | POA: Insufficient documentation

## 2019-07-02 DIAGNOSIS — M961 Postlaminectomy syndrome, not elsewhere classified: Secondary | ICD-10-CM | POA: Insufficient documentation

## 2019-07-02 DIAGNOSIS — M51369 Other intervertebral disc degeneration, lumbar region without mention of lumbar back pain or lower extremity pain: Secondary | ICD-10-CM | POA: Insufficient documentation

## 2019-07-02 NOTE — Progress Notes (Signed)
You do have cause for low back pain. You have a lot of arthritis in spine in disc and facet. You do have a little bit of forward slippage at L4/L5. Formula physical therapy could be beneficial. Would you like referral? Also considering sports medicine and injections could be beneficial in future as well. If pain continues consider sports medicine visit here in office with Dr. Darene Lamer and/or MRI for futher evaluation.

## 2019-07-09 DIAGNOSIS — M961 Postlaminectomy syndrome, not elsewhere classified: Secondary | ICD-10-CM | POA: Diagnosis not present

## 2019-07-09 DIAGNOSIS — M533 Sacrococcygeal disorders, not elsewhere classified: Secondary | ICD-10-CM | POA: Diagnosis not present

## 2019-07-09 DIAGNOSIS — M5416 Radiculopathy, lumbar region: Secondary | ICD-10-CM | POA: Diagnosis not present

## 2019-07-16 ENCOUNTER — Ambulatory Visit (INDEPENDENT_AMBULATORY_CARE_PROVIDER_SITE_OTHER): Payer: Federal, State, Local not specified - PPO

## 2019-07-16 ENCOUNTER — Other Ambulatory Visit: Payer: Self-pay

## 2019-07-16 DIAGNOSIS — R1905 Periumbilic swelling, mass or lump: Secondary | ICD-10-CM

## 2019-07-18 NOTE — Progress Notes (Signed)
No hernia seen in umbilical area.

## 2019-08-14 ENCOUNTER — Other Ambulatory Visit: Payer: Self-pay | Admitting: Physician Assistant

## 2019-08-14 DIAGNOSIS — U071 COVID-19: Secondary | ICD-10-CM | POA: Diagnosis not present

## 2019-08-14 DIAGNOSIS — Z20828 Contact with and (suspected) exposure to other viral communicable diseases: Secondary | ICD-10-CM | POA: Diagnosis not present

## 2019-08-14 DIAGNOSIS — Z03818 Encounter for observation for suspected exposure to other biological agents ruled out: Secondary | ICD-10-CM | POA: Diagnosis not present

## 2019-08-14 DIAGNOSIS — I10 Essential (primary) hypertension: Secondary | ICD-10-CM

## 2019-08-20 ENCOUNTER — Telehealth: Payer: Self-pay | Admitting: Neurology

## 2019-08-20 NOTE — Telephone Encounter (Signed)
Ok to write? 

## 2019-08-20 NOTE — Telephone Encounter (Signed)
Called patient - she felt bad on 08/07/2019 and stopped working on 08/08/2019. She was tested for Covid on 08/14/2019 and tested positive. Last fever was Friday (08/17/2019). She has restarted work today (works from home) but needs a note. 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 Okay to write?

## 2019-08-20 NOTE — Telephone Encounter (Signed)
Patient left vm that she tested positive for Covid on 08/08/2019 and needs note to RTW.

## 2019-08-21 NOTE — Telephone Encounter (Signed)
Letter written

## 2019-10-01 DIAGNOSIS — M961 Postlaminectomy syndrome, not elsewhere classified: Secondary | ICD-10-CM | POA: Diagnosis not present

## 2019-10-01 DIAGNOSIS — M533 Sacrococcygeal disorders, not elsewhere classified: Secondary | ICD-10-CM | POA: Diagnosis not present

## 2019-10-01 DIAGNOSIS — G894 Chronic pain syndrome: Secondary | ICD-10-CM | POA: Diagnosis not present

## 2019-11-12 ENCOUNTER — Other Ambulatory Visit: Payer: Self-pay | Admitting: Physician Assistant

## 2019-11-12 DIAGNOSIS — M5416 Radiculopathy, lumbar region: Secondary | ICD-10-CM

## 2019-11-12 DIAGNOSIS — M5126 Other intervertebral disc displacement, lumbar region: Secondary | ICD-10-CM

## 2019-11-12 DIAGNOSIS — E782 Mixed hyperlipidemia: Secondary | ICD-10-CM

## 2019-11-12 DIAGNOSIS — K2100 Gastro-esophageal reflux disease with esophagitis, without bleeding: Secondary | ICD-10-CM

## 2019-11-13 ENCOUNTER — Other Ambulatory Visit: Payer: Self-pay | Admitting: Neurology

## 2019-11-13 DIAGNOSIS — I10 Essential (primary) hypertension: Secondary | ICD-10-CM

## 2019-11-13 MED ORDER — LISINOPRIL 10 MG PO TABS: ORAL_TABLET | ORAL | 0 refills | Status: DC

## 2019-12-12 ENCOUNTER — Other Ambulatory Visit: Payer: Self-pay | Admitting: Neurology

## 2019-12-12 DIAGNOSIS — E782 Mixed hyperlipidemia: Secondary | ICD-10-CM

## 2019-12-12 DIAGNOSIS — K2100 Gastro-esophageal reflux disease with esophagitis, without bleeding: Secondary | ICD-10-CM

## 2019-12-12 MED ORDER — ROSUVASTATIN CALCIUM 40 MG PO TABS: 40.0000 mg | ORAL_TABLET | Freq: Every day | ORAL | 0 refills | Status: DC

## 2019-12-12 MED ORDER — ESOMEPRAZOLE MAGNESIUM 40 MG PO CPDR: DELAYED_RELEASE_CAPSULE | ORAL | 0 refills | Status: DC

## 2019-12-26 DIAGNOSIS — M533 Sacrococcygeal disorders, not elsewhere classified: Secondary | ICD-10-CM | POA: Diagnosis not present

## 2019-12-26 DIAGNOSIS — M47816 Spondylosis without myelopathy or radiculopathy, lumbar region: Secondary | ICD-10-CM | POA: Diagnosis not present

## 2019-12-26 DIAGNOSIS — M961 Postlaminectomy syndrome, not elsewhere classified: Secondary | ICD-10-CM | POA: Diagnosis not present

## 2019-12-26 DIAGNOSIS — Z5181 Encounter for therapeutic drug level monitoring: Secondary | ICD-10-CM | POA: Diagnosis not present

## 2020-01-10 DIAGNOSIS — M533 Sacrococcygeal disorders, not elsewhere classified: Secondary | ICD-10-CM | POA: Diagnosis not present

## 2020-01-10 DIAGNOSIS — G894 Chronic pain syndrome: Secondary | ICD-10-CM | POA: Diagnosis not present

## 2020-01-10 DIAGNOSIS — M961 Postlaminectomy syndrome, not elsewhere classified: Secondary | ICD-10-CM | POA: Diagnosis not present

## 2020-01-10 DIAGNOSIS — Z5181 Encounter for therapeutic drug level monitoring: Secondary | ICD-10-CM | POA: Diagnosis not present

## 2020-02-13 ENCOUNTER — Ambulatory Visit (INDEPENDENT_AMBULATORY_CARE_PROVIDER_SITE_OTHER): Payer: Federal, State, Local not specified - PPO | Admitting: Physician Assistant

## 2020-02-13 ENCOUNTER — Other Ambulatory Visit: Payer: Self-pay

## 2020-02-13 ENCOUNTER — Encounter: Payer: Self-pay | Admitting: Physician Assistant

## 2020-02-13 VITALS — BP 128/78 | HR 70 | Ht 70.0 in | Wt 184.0 lb

## 2020-02-13 DIAGNOSIS — M5416 Radiculopathy, lumbar region: Secondary | ICD-10-CM | POA: Diagnosis not present

## 2020-02-13 DIAGNOSIS — D135 Benign neoplasm of extrahepatic bile ducts: Secondary | ICD-10-CM

## 2020-02-13 DIAGNOSIS — I10 Essential (primary) hypertension: Secondary | ICD-10-CM

## 2020-02-13 DIAGNOSIS — K2100 Gastro-esophageal reflux disease with esophagitis, without bleeding: Secondary | ICD-10-CM | POA: Diagnosis not present

## 2020-02-13 DIAGNOSIS — K5909 Other constipation: Secondary | ICD-10-CM

## 2020-02-13 DIAGNOSIS — E782 Mixed hyperlipidemia: Secondary | ICD-10-CM

## 2020-02-13 DIAGNOSIS — Z131 Encounter for screening for diabetes mellitus: Secondary | ICD-10-CM

## 2020-02-13 DIAGNOSIS — G43009 Migraine without aura, not intractable, without status migrainosus: Secondary | ICD-10-CM

## 2020-02-13 DIAGNOSIS — Z780 Asymptomatic menopausal state: Secondary | ICD-10-CM | POA: Insufficient documentation

## 2020-02-13 DIAGNOSIS — M5126 Other intervertebral disc displacement, lumbar region: Secondary | ICD-10-CM

## 2020-02-13 DIAGNOSIS — R7303 Prediabetes: Secondary | ICD-10-CM | POA: Diagnosis not present

## 2020-02-13 LAB — COMPLETE METABOLIC PANEL WITH GFR
AG Ratio: 2.1 (calc) (ref 1.0–2.5)
ALT: 17 U/L (ref 6–29)
AST: 17 U/L (ref 10–35)
Albumin: 4.7 g/dL (ref 3.6–5.1)
Alkaline phosphatase (APISO): 78 U/L (ref 37–153)
BUN: 16 mg/dL (ref 7–25)
CO2: 30 mmol/L (ref 20–32)
Calcium: 9.9 mg/dL (ref 8.6–10.4)
Chloride: 102 mmol/L (ref 98–110)
Creat: 0.78 mg/dL (ref 0.50–0.99)
GFR, Est African American: 95 mL/min/{1.73_m2} (ref >=60)
GFR, Est Non African American: 82 mL/min/{1.73_m2} (ref >=60)
Globulin: 2.2 g/dL (calc) (ref 1.9–3.7)
Glucose, Bld: 103 mg/dL — ABNORMAL HIGH (ref 65–99)
Potassium: 4.3 mmol/L (ref 3.5–5.3)
Sodium: 140 mmol/L (ref 135–146)
Total Bilirubin: 0.7 mg/dL (ref 0.2–1.2)
Total Protein: 6.9 g/dL (ref 6.1–8.1)

## 2020-02-13 LAB — CBC
HCT: 41.6 % (ref 35.0–45.0)
Hemoglobin: 13.9 g/dL (ref 11.7–15.5)
MCH: 30 pg (ref 27.0–33.0)
MCHC: 33.4 g/dL (ref 32.0–36.0)
MCV: 89.7 fL (ref 80.0–100.0)
MPV: 12.2 fL (ref 7.5–12.5)
Platelets: 241 10*3/uL (ref 140–400)
RBC: 4.64 10*6/uL (ref 3.80–5.10)
RDW: 13.2 % (ref 11.0–15.0)
WBC: 5.7 10*3/uL (ref 3.8–10.8)

## 2020-02-13 LAB — LIPID PANEL W/REFLEX DIRECT LDL
Cholesterol: 174 mg/dL (ref <200)
HDL: 69 mg/dL (ref >=50)
LDL Cholesterol (Calc): 85 mg/dL (calc)
Non-HDL Cholesterol (Calc): 105 mg/dL (calc) (ref <130)
Total CHOL/HDL Ratio: 2.5 (calc) (ref <5.0)
Triglycerides: 107 mg/dL (ref <150)

## 2020-02-13 LAB — HEMOGLOBIN A1C
Hgb A1c MFr Bld: 6.3 % of total Hgb — ABNORMAL HIGH (ref <5.7)
Mean Plasma Glucose: 134 (calc)
eAG (mmol/L): 7.4 (calc)

## 2020-02-13 MED ORDER — PROMETHAZINE HCL 25 MG PO TABS: 25.0000 mg | ORAL_TABLET | ORAL | 5 refills | Status: DC | PRN

## 2020-02-13 MED ORDER — IBUPROFEN 800 MG PO TABS: 800.0000 mg | ORAL_TABLET | Freq: Three times a day (TID) | ORAL | 1 refills | Status: DC | PRN

## 2020-02-13 MED ORDER — ESOMEPRAZOLE MAGNESIUM 40 MG PO CPDR: DELAYED_RELEASE_CAPSULE | ORAL | 3 refills | Status: DC

## 2020-02-13 MED ORDER — ROSUVASTATIN CALCIUM 40 MG PO TABS: 40.0000 mg | ORAL_TABLET | Freq: Every day | ORAL | 3 refills | Status: DC

## 2020-02-13 MED ORDER — AMITRIPTYLINE HCL 50 MG PO TABS: ORAL_TABLET | ORAL | 3 refills | Status: DC

## 2020-02-13 MED ORDER — LISINOPRIL 10 MG PO TABS: ORAL_TABLET | ORAL | 1 refills | Status: DC

## 2020-02-13 NOTE — Progress Notes (Signed)
Subjective:    Patient ID: Monica Brooks, female    DOB: [birth year], 59 y.o.   MRN: [id number]  HPI  Pt is a 59 yo female with HTN, chronic pain low back pain, constipation, migraines who presents to the clinic for medication refill.   She is doing well. She continues to have some intermittent right lower quadrant pain but she is also constipated from opioids. She has appt with digestive health later this month. She gets opioids from pain management.   Pt denies any CP, palpitations, headaches, vision changes, SOB. She is trying to walk a few times a week.   .. Active Ambulatory Problems    Diagnosis Date Noted  . Hyperlipidemia 09/15/2007  . Esophageal reflux 09/15/2007  . IRRITABLE BOWEL SYNDROME, HX OF 09/15/2007  . Hematemesis 11/04/2012  . Syncope 11/04/2012  . Stricture and stenosis of esophagus 11/04/2012  . Lumbar disc herniation 10/08/2015  . Migraine without aura and with status migrainosus, not intractable 10/08/2015  . Primary osteoarthritis involving multiple joints 10/11/2015  . Lumbar radiculopathy   . Hematuria 04/23/2016  . Low iron stores 04/25/2016  . Elevated liver enzymes 04/25/2016  . Current smoker 11/06/2017  . Essential hypertension 10/30/2018  . Emphysema lung ([place]) 12/27/2018  . Pre-diabetes 01/19/2019  . Periumbilical mass 41/28/7867  . DDD (degenerative disc disease), lumbar 07/02/2019  . Facet arthropathy 07/02/2019  . Acute right-sided low back pain without sciatica 07/02/2019  . Post laminectomy syndrome 07/02/2019  . Sacroiliac joint pain 07/02/2019  . 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 Post-menopausal 02/13/2020  . Chronic constipation 02/13/2020   Resolved Ambulatory Problems    Diagnosis Date Noted  . No Resolved Ambulatory Problems   Past Medical History:  Diagnosis Date  . Allergy   . Anemia   . Anxiety   . Arthritis   . Chronic headaches   . Colon polyps   . Esophagitis 1992  . GERD (gastroesophageal reflux  disease)   . HLD (hyperlipidemia)   . HTN (hypertension)   . IBS (irritable bowel syndrome)   . Sliding hiatal hernia 1992  . Stricture of esophagus 1992  . Ulcer      Review of Systems  All other systems reviewed and are negative.      Objective:   Physical Exam Vitals reviewed.  Constitutional:      Appearance: Normal appearance.  HENT:     Head: Normocephalic.  Neck:     Vascular: No carotid bruit.  Cardiovascular:     Rate and Rhythm: Normal rate and regular rhythm.     Pulses: Normal pulses.  Pulmonary:     Effort: Pulmonary effort is normal.     Breath sounds: Normal breath sounds.  Neurological:     General: No focal deficit present.     Mental Status: She is alert and oriented to person, place, and time.  Psychiatric:        Mood and Affect: Mood normal.           Assessment & Plan:  Marland KitchenMarland KitchenKourtlyn was seen today for hypertension and hyperlipidemia.  Diagnoses and all orders for this visit:  Essential hypertension -     lisinopril (ZESTRIL) 10 MG tablet; TAKE 1 TABLET BY MOUTH DAILY -     COMPLETE METABOLIC PANEL WITH GFR  Lumbar disc herniation -     amitriptyline (ELAVIL) 50 MG tablet; TAKE 1 TABLET(50 MG) BY MOUTH AT BEDTIME -     ibuprofen (ADVIL) 800 MG tablet; Take 1  tablet (800 mg total) by mouth every 8 (eight) hours as needed.  Lumbar radiculopathy -     amitriptyline (ELAVIL) 50 MG tablet; TAKE 1 TABLET(50 MG) BY MOUTH AT BEDTIME -     ibuprofen (ADVIL) 800 MG tablet; Take 1 tablet (800 mg total) by mouth every 8 (eight) hours as needed.  Gastroesophageal reflux disease with esophagitis -     esomeprazole (NEXIUM) 40 MG capsule; TAKE ONE CAPSULE BY MOUTH TWICE DAILY BEFORE A MEAL  Migraine without aura and without status migrainosus, not intractable -     promethazine (PHENERGAN) 25 MG tablet; Take 1 tablet (25 mg total) by mouth as needed for nausea or vomiting.  Mixed hyperlipidemia -     rosuvastatin (CRESTOR) 40 MG tablet; Take 1  tablet (40 mg total) by mouth daily. -     Lipid Panel w/reflex Direct LDL  Screening for diabetes mellitus -     Hemoglobin A1c  Pre-diabetes -     Hemoglobin A1c -     CBC  Post-menopausal -     VITAMIN D 25 Hydroxy (Vit-D Deficiency, Fractures)  Adenomyomatosis of gallbladder  Chronic constipation   Pt needs yearly labs. Ordered today.  Refills sent.  BP great.   Needs mammogram. Stated she had it done. She will call downstairs and confirm this.   Reviewed u/s gallbladder results and adenomyomatosis of gallbladder. She is having some bowel changes worth mentioning to GI later this month. Will send him u/s report.   Managed by pain clinic.

## 2020-02-14 NOTE — Progress Notes (Signed)
Aleesia,   Cholesterol looks GREAT.  Kidney, liver look great.  A!C continues in pre-diabetes range. Continue to work on diet/exercise to maintain. Recheck in 6 months. Anything over 6.0 we could discuss metformin to help with insulin resistance. Would you like to consider?

## 2020-02-21 ENCOUNTER — Encounter: Payer: Self-pay | Admitting: Neurology

## 2020-02-25 DIAGNOSIS — D135 Benign neoplasm of extrahepatic bile ducts: Secondary | ICD-10-CM | POA: Diagnosis not present

## 2020-02-25 DIAGNOSIS — K5903 Drug induced constipation: Secondary | ICD-10-CM | POA: Diagnosis not present

## 2020-02-25 DIAGNOSIS — Z86010 Personal history of colonic polyps: Secondary | ICD-10-CM | POA: Diagnosis not present

## 2020-03-20 DIAGNOSIS — G894 Chronic pain syndrome: Secondary | ICD-10-CM | POA: Diagnosis not present

## 2020-03-20 DIAGNOSIS — M961 Postlaminectomy syndrome, not elsewhere classified: Secondary | ICD-10-CM | POA: Diagnosis not present

## 2020-03-20 DIAGNOSIS — M533 Sacrococcygeal disorders, not elsewhere classified: Secondary | ICD-10-CM | POA: Diagnosis not present

## 2020-03-27 DIAGNOSIS — K621 Rectal polyp: Secondary | ICD-10-CM | POA: Diagnosis not present

## 2020-03-27 DIAGNOSIS — Z1211 Encounter for screening for malignant neoplasm of colon: Secondary | ICD-10-CM | POA: Diagnosis not present

## 2020-03-27 DIAGNOSIS — Z86010 Personal history of colonic polyps: Secondary | ICD-10-CM | POA: Diagnosis not present

## 2020-04-01 LAB — HM COLONOSCOPY

## 2020-04-08 ENCOUNTER — Encounter: Payer: Self-pay | Admitting: Neurology

## 2020-04-08 ENCOUNTER — Other Ambulatory Visit: Payer: Self-pay | Admitting: Neurology

## 2020-04-08 DIAGNOSIS — K621 Rectal polyp: Secondary | ICD-10-CM | POA: Insufficient documentation

## 2020-07-16 DIAGNOSIS — M961 Postlaminectomy syndrome, not elsewhere classified: Secondary | ICD-10-CM | POA: Diagnosis not present

## 2020-07-16 DIAGNOSIS — M5416 Radiculopathy, lumbar region: Secondary | ICD-10-CM | POA: Diagnosis not present

## 2020-07-16 DIAGNOSIS — Z79899 Other long term (current) drug therapy: Secondary | ICD-10-CM | POA: Diagnosis not present

## 2020-07-16 DIAGNOSIS — G894 Chronic pain syndrome: Secondary | ICD-10-CM | POA: Diagnosis not present

## 2020-08-13 ENCOUNTER — Other Ambulatory Visit: Payer: Self-pay | Admitting: Physician Assistant

## 2020-08-13 DIAGNOSIS — I10 Essential (primary) hypertension: Secondary | ICD-10-CM

## 2020-10-12 ENCOUNTER — Other Ambulatory Visit: Payer: Self-pay | Admitting: Physician Assistant

## 2020-10-12 DIAGNOSIS — M5126 Other intervertebral disc displacement, lumbar region: Secondary | ICD-10-CM

## 2020-10-12 DIAGNOSIS — M5416 Radiculopathy, lumbar region: Secondary | ICD-10-CM

## 2020-10-13 DIAGNOSIS — M961 Postlaminectomy syndrome, not elsewhere classified: Secondary | ICD-10-CM | POA: Diagnosis not present

## 2020-10-13 DIAGNOSIS — M533 Sacrococcygeal disorders, not elsewhere classified: Secondary | ICD-10-CM | POA: Diagnosis not present

## 2020-10-13 DIAGNOSIS — G894 Chronic pain syndrome: Secondary | ICD-10-CM | POA: Diagnosis not present

## 2020-10-13 DIAGNOSIS — M47816 Spondylosis without myelopathy or radiculopathy, lumbar region: Secondary | ICD-10-CM | POA: Diagnosis not present

## 2020-11-25 ENCOUNTER — Other Ambulatory Visit: Payer: Self-pay | Admitting: Neurology

## 2020-11-25 DIAGNOSIS — I10 Essential (primary) hypertension: Secondary | ICD-10-CM

## 2020-11-25 MED ORDER — LISINOPRIL 10 MG PO TABS: ORAL_TABLET | ORAL | 0 refills | Status: DC

## 2021-01-06 ENCOUNTER — Other Ambulatory Visit: Payer: Self-pay | Admitting: Physician Assistant

## 2021-01-06 DIAGNOSIS — I10 Essential (primary) hypertension: Secondary | ICD-10-CM

## 2021-01-12 ENCOUNTER — Telehealth: Payer: Self-pay | Admitting: Neurology

## 2021-01-12 DIAGNOSIS — M5416 Radiculopathy, lumbar region: Secondary | ICD-10-CM

## 2021-01-12 DIAGNOSIS — M5126 Other intervertebral disc displacement, lumbar region: Secondary | ICD-10-CM

## 2021-01-12 MED ORDER — AMITRIPTYLINE HCL 50 MG PO TABS: ORAL_TABLET | ORAL | 0 refills | Status: DC

## 2021-01-12 NOTE — Telephone Encounter (Signed)
Prescription request

## 2021-01-19 DIAGNOSIS — M961 Postlaminectomy syndrome, not elsewhere classified: Secondary | ICD-10-CM | POA: Diagnosis not present

## 2021-01-19 DIAGNOSIS — G894 Chronic pain syndrome: Secondary | ICD-10-CM | POA: Diagnosis not present

## 2021-01-19 DIAGNOSIS — M5412 Radiculopathy, cervical region: Secondary | ICD-10-CM | POA: Diagnosis not present

## 2021-01-19 DIAGNOSIS — M5416 Radiculopathy, lumbar region: Secondary | ICD-10-CM | POA: Diagnosis not present

## 2021-01-19 DIAGNOSIS — Z79899 Other long term (current) drug therapy: Secondary | ICD-10-CM | POA: Diagnosis not present

## 2021-02-02 ENCOUNTER — Other Ambulatory Visit: Payer: Self-pay | Admitting: Physician Assistant

## 2021-02-02 ENCOUNTER — Other Ambulatory Visit: Payer: Self-pay | Admitting: Neurology

## 2021-02-02 DIAGNOSIS — I10 Essential (primary) hypertension: Secondary | ICD-10-CM

## 2021-02-02 DIAGNOSIS — Z1231 Encounter for screening mammogram for malignant neoplasm of breast: Secondary | ICD-10-CM

## 2021-02-02 MED ORDER — LISINOPRIL 10 MG PO TABS: ORAL_TABLET | ORAL | 0 refills | Status: DC

## 2021-02-12 ENCOUNTER — Ambulatory Visit (INDEPENDENT_AMBULATORY_CARE_PROVIDER_SITE_OTHER): Payer: Federal, State, Local not specified - PPO

## 2021-02-12 ENCOUNTER — Other Ambulatory Visit: Payer: Self-pay

## 2021-02-12 DIAGNOSIS — Z1231 Encounter for screening mammogram for malignant neoplasm of breast: Secondary | ICD-10-CM | POA: Diagnosis not present

## 2021-02-13 NOTE — Progress Notes (Signed)
Normal mammogram. Follow up in 1 year.

## 2021-02-16 ENCOUNTER — Other Ambulatory Visit: Payer: Self-pay

## 2021-02-16 ENCOUNTER — Encounter: Payer: Self-pay | Admitting: Physician Assistant

## 2021-02-16 ENCOUNTER — Ambulatory Visit (INDEPENDENT_AMBULATORY_CARE_PROVIDER_SITE_OTHER): Payer: Federal, State, Local not specified - PPO | Admitting: Physician Assistant

## 2021-02-16 VITALS — BP 128/86 | HR 77 | Wt 175.6 lb

## 2021-02-16 DIAGNOSIS — R7303 Prediabetes: Secondary | ICD-10-CM

## 2021-02-16 DIAGNOSIS — F17200 Nicotine dependence, unspecified, uncomplicated: Secondary | ICD-10-CM

## 2021-02-16 DIAGNOSIS — Z634 Disappearance and death of family member: Secondary | ICD-10-CM

## 2021-02-16 DIAGNOSIS — Z0000 Encounter for general adult medical examination without abnormal findings: Secondary | ICD-10-CM | POA: Diagnosis not present

## 2021-02-16 DIAGNOSIS — M5126 Other intervertebral disc displacement, lumbar region: Secondary | ICD-10-CM

## 2021-02-16 DIAGNOSIS — M5416 Radiculopathy, lumbar region: Secondary | ICD-10-CM

## 2021-02-16 DIAGNOSIS — I10 Essential (primary) hypertension: Secondary | ICD-10-CM

## 2021-02-16 DIAGNOSIS — Z1329 Encounter for screening for other suspected endocrine disorder: Secondary | ICD-10-CM

## 2021-02-16 DIAGNOSIS — R110 Nausea: Secondary | ICD-10-CM

## 2021-02-16 DIAGNOSIS — F4321 Adjustment disorder with depressed mood: Secondary | ICD-10-CM

## 2021-02-16 DIAGNOSIS — Z122 Encounter for screening for malignant neoplasm of respiratory organs: Secondary | ICD-10-CM

## 2021-02-16 DIAGNOSIS — K2100 Gastro-esophageal reflux disease with esophagitis, without bleeding: Secondary | ICD-10-CM

## 2021-02-16 DIAGNOSIS — E782 Mixed hyperlipidemia: Secondary | ICD-10-CM | POA: Diagnosis not present

## 2021-02-16 MED ORDER — ONDANSETRON HCL 8 MG PO TABS
8.0000 mg | ORAL_TABLET | Freq: Three times a day (TID) | ORAL | 3 refills | Status: DC | PRN
Start: 2021-02-16 — End: 2022-12-07

## 2021-02-16 MED ORDER — CELECOXIB 200 MG PO CAPS: 200.0000 mg | ORAL_CAPSULE | Freq: Two times a day (BID) | ORAL | 3 refills | Status: DC

## 2021-02-16 MED ORDER — KETOROLAC TROMETHAMINE 60 MG/2ML IM SOLN
60.0000 mg | Freq: Once | INTRAMUSCULAR | Status: AC
  Administered 2021-02-16: 60 mg via INTRAMUSCULAR

## 2021-02-16 MED ORDER — LISINOPRIL 10 MG PO TABS: ORAL_TABLET | ORAL | 1 refills | Status: DC

## 2021-02-16 MED ORDER — PREGABALIN 150 MG PO CAPS: 300.0000 mg | ORAL_CAPSULE | Freq: Two times a day (BID) | ORAL | 3 refills | Status: DC

## 2021-02-16 MED ORDER — ESOMEPRAZOLE MAGNESIUM 40 MG PO CPDR: DELAYED_RELEASE_CAPSULE | ORAL | 3 refills | Status: DC

## 2021-02-16 MED ORDER — AMITRIPTYLINE HCL 100 MG PO TABS: 100.0000 mg | ORAL_TABLET | Freq: Every day | ORAL | 1 refills | Status: DC

## 2021-02-16 NOTE — Progress Notes (Signed)
Subjective:     Monica Brooks is a 60 y.o. female and is here for a comprehensive physical exam. The patient reports problems - her low back pain and radiculopathy have been worse. She got a toradol shot before in office and really helped. Would like to have again. She is not sleeping well. Her son died suddenly at 70 yo from hypoglycemia.   She does ok during the day but really struggles to calm down at night.   Social History   Socioeconomic History   Marital status: Single    Spouse name: Not on file   Number of children: 2   Years of education: Not on file   Highest education level: Not on file  Occupational History   Occupation: management    Employer: [employer]  Tobacco Use   Smoking status: Every Day    Packs/day: 0.80    Years: 38.00    Pack years: 30.40    Types: Cigarettes   Smokeless tobacco: Never  Substance and Sexual Activity   Alcohol use: No   Drug use: No   Sexual activity: Yes  Other Topics Concern   Not on file  Social History Narrative   Not on file   Social Determinants of Health   Financial Resource Strain: Not on file  Food Insecurity: Not on file  Transportation Needs: Not on file  Physical Activity: Not on file  Stress: Not on file  Social Connections: Not on file  Intimate Partner Violence: Not on file   Health Maintenance  Topic Date Due   HIV Screening  Never done   Pneumococcal Vaccine 81-14 Years old (2 - PCV) 11/05/2013   Zoster Vaccines- Shingrix (2 of 2) 08/23/2019   COVID-19 Vaccine (3 - Pfizer risk series) 10/27/2019   INFLUENZA VACCINE  01/26/2021   MAMMOGRAM  02/13/2023   TETANUS/TDAP  11/02/2027   COLONOSCOPY (Pts 45-44yr Insurance coverage will need to be confirmed)  04/01/2030   Hepatitis C Screening  Completed   HPV VACCINES  Aged Out    The following portions of the patient's history were reviewed and updated as appropriate: allergies, current medications, past family history, past medical history, past social history, past  surgical history, and problem list.  Review of Systems Pertinent items noted in HPI and remainder of comprehensive ROS otherwise negative.   Objective:    BP 128/86 (BP Location: Right Arm, Patient Position: Sitting, Cuff Size: Normal)   Pulse 77   Wt 175 lb 9.6 oz (79.7 kg)   SpO2 100%   BMI 25.20 kg/m  General appearance: alert, cooperative, and appears stated age Head: Normocephalic, without obvious abnormality, atraumatic Eyes: conjunctivae/corneas clear. PERRL, EOM's intact. Fundi benign. Ears: normal TM's and external ear canals both ears Nose: Nares normal. Septum midline. Mucosa normal. No drainage or sinus tenderness. Throat: lips, mucosa, and tongue normal; teeth and gums normal Neck: no adenopathy, no carotid bruit, no JVD, supple, symmetrical, trachea midline, and thyroid not enlarged, symmetric, no tenderness/mass/nodules Back: symmetric, no curvature. ROM normal. No CVA tenderness. Lungs: clear to auscultation bilaterally Heart: regular rate and rhythm, S1, S2 normal, no murmur, click, rub or gallop Abdomen: soft, non-tender; bowel sounds normal; no masses,  no organomegaly Extremities: extremities normal, atraumatic, no cyanosis or edema Pulses: 2+ and symmetric Skin: Skin color, texture, turgor normal. No rashes or lesions Lymph nodes: Cervical, supraclavicular, and axillary nodes normal. Neurologic: Alert and oriented X 3, normal strength and tone. Normal symmetric reflexes. Normal coordination and gait   ..Marland Kitchen  Depression screen [name] 2/9 02/16/2021 02/13/2020 11/01/2017 05/13/2017  Decreased Interest 1 0 0 0  Down, Depressed, Hopeless 1 0 0 0  PHQ - 2 Score 2 0 0 0  Altered sleeping 2 - - -  Tired, decreased energy 1 - - -  Change in appetite 1 - - -  Feeling bad or failure about yourself  0 - - -  Trouble concentrating 1 - - -  Moving slowly or fidgety/restless 0 - - -  Suicidal thoughts 0 - - -  PHQ-9 Score 7 - - -  Difficult doing work/chores Somewhat difficult -  - -   .Marland Kitchen GAD 7 : Generalized Anxiety Score 02/16/2021  Nervous, Anxious, on Edge 0  Control/stop worrying 2  Worry too much - different things 1  Trouble relaxing 0  Restless 0  Easily annoyed or irritable 0  Afraid - awful might happen 1  Total GAD 7 Score 4  Anxiety Difficulty Somewhat difficult     Assessment:    Healthy female exam.     Plan:    Marland KitchenMarland KitchenTahitia was seen today for annual exam.  Diagnoses and all orders for this visit:  Physical exam -     TSH -     Lipid Panel w/reflex Direct LDL -     COMPLETE METABOLIC PANEL WITH GFR -     CBC with Differential/Platelet -     Hemoglobin A1c  Essential hypertension -     COMPLETE METABOLIC PANEL WITH GFR -     lisinopril (ZESTRIL) 10 MG tablet; TAKE 1 TABLET BY MOUTH DAILY.  Mixed hyperlipidemia -     Lipid Panel w/reflex Direct LDL  Pre-diabetes -     COMPLETE METABOLIC PANEL WITH GFR -     Hemoglobin A1c  Thyroid disorder screen  Gastroesophageal reflux disease with esophagitis, unspecified whether hemorrhage -     esomeprazole (NEXIUM) 40 MG capsule; TAKE ONE CAPSULE BY MOUTH TWICE DAILY BEFORE A MEAL  Lumbar disc herniation -     celecoxib (CELEBREX) 200 MG capsule; Take 1 capsule (200 mg total) by mouth 2 (two) times daily. -     pregabalin (LYRICA) 150 MG capsule; Take 2 capsules (300 mg total) by mouth 2 (two) times daily. -     amitriptyline (ELAVIL) 100 MG tablet; Take 1 tablet (100 mg total) by mouth at bedtime. -     ketorolac (TORADOL) injection 60 mg  Lumbar radiculopathy -     celecoxib (CELEBREX) 200 MG capsule; Take 1 capsule (200 mg total) by mouth 2 (two) times daily. -     pregabalin (LYRICA) 150 MG capsule; Take 2 capsules (300 mg total) by mouth 2 (two) times daily. -     amitriptyline (ELAVIL) 100 MG tablet; Take 1 tablet (100 mg total) by mouth at bedtime. -     ketorolac (TORADOL) injection 60 mg  Grief at loss of child  Nausea -     ondansetron (ZOFRAN) 8 MG tablet; Take 1 tablet (8  mg total) by mouth every 8 (eight) hours as needed for nausea or vomiting.  .. Discussed 150 minutes of exercise a week.  Encouraged vitamin D 1000 units and Calcium '1300mg'$  or 4 servings of dairy a day.  Fasting labs ordered.  PHQ/GAD not to goal. Pt has a lot of grief. Declined counseling referral. Declined medication changes.  Colonoscopy UTD.  Mammogram/bone density UTD.  Discussed shingrix. Declined.  Needs covid 2nd booster.  Needs flu shot.   Increased elavil  for pain and to see if helps sleep some. Follow up as needed for grief.   Declined smoking cessation.   See After Visit Summary for Counseling Recommendations

## 2021-02-16 NOTE — Patient Instructions (Addendum)
Toradol '60mg'$   Health Maintenance, Female Adopting a healthy lifestyle and getting preventive care are important in promoting health and wellness. Ask your health care provider about: The right schedule for you to have regular tests and exams. Things you can do on your own to prevent diseases and keep yourself healthy. What should I know about diet, weight, and exercise? Eat a healthy diet  Eat a diet that includes plenty of vegetables, fruits, low-fat dairy products, and lean protein. Do not eat a lot of foods that are high in solid fats, added sugars, or sodium.  Maintain a healthy weight Body mass index (BMI) is used to identify weight problems. It estimates body fat based on height and weight. Your health care provider can help determineyour BMI and help you achieve or maintain a healthy weight. Get regular exercise Get regular exercise. This is one of the most important things you can do for your health. Most adults should: Exercise for at least 150 minutes each week. The exercise should increase your heart rate and make you sweat (moderate-intensity exercise). Do strengthening exercises at least twice a week. This is in addition to the moderate-intensity exercise. Spend less time sitting. Even light physical activity can be beneficial. Watch cholesterol and blood lipids Have your blood tested for lipids and cholesterol at 62 years of age, then havethis test every 5 years. Have your cholesterol levels checked more often if: Your lipid or cholesterol levels are high. You are older than 62 years of age. You are at high risk for heart disease. What should I know about cancer screening? Depending on your health history and family history, you may need to have cancer screening at various ages. This may include screening for: Breast cancer. Cervical cancer. Colorectal cancer. Skin cancer. Lung cancer. What should I know about heart disease, diabetes, and high blood pressure? Blood  pressure and heart disease High blood pressure causes heart disease and increases the risk of stroke. This is more likely to develop in people who have high blood pressure readings, are of African descent, or are overweight. Have your blood pressure checked: Every 3-5 years if you are 77-52 years of age. Every year if you are 23 years old or older. Diabetes Have regular diabetes screenings. This checks your fasting blood sugar level. Have the screening done: Once every three years after age 15 if you are at a normal weight and have a low risk for diabetes. More often and at a younger age if you are overweight or have a high risk for diabetes. What should I know about preventing infection? Hepatitis B If you have a higher risk for hepatitis B, you should be screened for this virus. Talk with your health care provider to find out if you are at risk forhepatitis B infection. Hepatitis C Testing is recommended for: Everyone born from 18 through 1965. Anyone with known risk factors for hepatitis C. Sexually transmitted infections (STIs) Get screened for STIs, including gonorrhea and chlamydia, if: You are sexually active and are younger than 62 years of age. You are older than 62 years of age and your health care provider tells you that you are at risk for this type of infection. Your sexual activity has changed since you were last screened, and you are at increased risk for chlamydia or gonorrhea. Ask your health care provider if you are at risk. Ask your health care provider about whether you are at high risk for HIV. Your health care provider may recommend a prescription  medicine to help prevent HIV infection. If you choose to take medicine to prevent HIV, you should first get tested for HIV. You should then be tested every 3 months for as long as you are taking the medicine. Pregnancy If you are about to stop having your period (premenopausal) and you may become pregnant, seek counseling  before you get pregnant. Take 400 to 800 micrograms (mcg) of folic acid every day if you become pregnant. Ask for birth control (contraception) if you want to prevent pregnancy. Osteoporosis and menopause Osteoporosis is a disease in which the bones lose minerals and strength with aging. This can result in bone fractures. If you are 58 years old or older, or if you are at risk for osteoporosis and fractures, ask your health care provider if you should: Be screened for bone loss. Take a calcium or vitamin D supplement to lower your risk of fractures. Be given hormone replacement therapy (HRT) to treat symptoms of menopause. Follow these instructions at home: Lifestyle Do not use any products that contain nicotine or tobacco, such as cigarettes, e-cigarettes, and chewing tobacco. If you need help quitting, ask your health care provider. Do not use street drugs. Do not share needles. Ask your health care provider for help if you need support or information about quitting drugs. Alcohol use Do not drink alcohol if: Your health care provider tells you not to drink. You are pregnant, may be pregnant, or are planning to become pregnant. If you drink alcohol: Limit how much you use to 0-1 drink a day. Limit intake if you are breastfeeding. Be aware of how much alcohol is in your drink. In the [place] one drink equals one 12 oz bottle of beer (355 mL), one 5 oz glass of wine (148 mL), or one 1 oz glass of hard liquor (44 mL). General instructions Schedule regular health, dental, and eye exams. Stay current with your vaccines. Tell your health care provider if: You often feel depressed. You have ever been abused or do not feel safe at home. Summary Adopting a healthy lifestyle and getting preventive care are important in promoting health and wellness. Follow your health care provider's instructions about healthy diet, exercising, and getting tested or screened for diseases. Follow your health  care provider's instructions on monitoring your cholesterol and blood pressure. This information is not intended to replace advice given to you by your health care provider. Make sure you discuss any questions you have with your healthcare provider. Document Revised: 06/07/2018 Document Reviewed: 06/07/2018 Elsevier Patient Education  2022 [employer].

## 2021-02-17 DIAGNOSIS — F4321 Adjustment disorder with depressed mood: Secondary | ICD-10-CM | POA: Insufficient documentation

## 2021-02-17 DIAGNOSIS — R110 Nausea: Secondary | ICD-10-CM | POA: Insufficient documentation

## 2021-02-17 DIAGNOSIS — Z634 Disappearance and death of family member: Secondary | ICD-10-CM | POA: Insufficient documentation

## 2021-03-13 DIAGNOSIS — I10 Essential (primary) hypertension: Secondary | ICD-10-CM | POA: Diagnosis not present

## 2021-03-13 DIAGNOSIS — E782 Mixed hyperlipidemia: Secondary | ICD-10-CM | POA: Diagnosis not present

## 2021-03-13 DIAGNOSIS — R7303 Prediabetes: Secondary | ICD-10-CM | POA: Diagnosis not present

## 2021-03-13 DIAGNOSIS — Z0000 Encounter for general adult medical examination without abnormal findings: Secondary | ICD-10-CM | POA: Diagnosis not present

## 2021-03-14 LAB — LIPID PANEL W/REFLEX DIRECT LDL
Cholesterol: 183 mg/dL (ref <200)
HDL: 57 mg/dL (ref >=50)
LDL Cholesterol (Calc): 107 mg/dL (calc) — ABNORMAL HIGH
Non-HDL Cholesterol (Calc): 126 mg/dL (calc) (ref <130)
Total CHOL/HDL Ratio: 3.2 (calc) (ref <5.0)
Triglycerides: 97 mg/dL (ref <150)

## 2021-03-14 LAB — COMPLETE METABOLIC PANEL WITH GFR
AG Ratio: 1.8 (calc) (ref 1.0–2.5)
ALT: 21 U/L (ref 6–29)
AST: 22 U/L (ref 10–35)
Albumin: 4.8 g/dL (ref 3.6–5.1)
Alkaline phosphatase (APISO): 79 U/L (ref 37–153)
BUN: 17 mg/dL (ref 7–25)
CO2: 32 mmol/L (ref 20–32)
Calcium: 10 mg/dL (ref 8.6–10.4)
Chloride: 101 mmol/L (ref 98–110)
Creat: 0.7 mg/dL (ref 0.50–1.05)
Globulin: 2.6 g/dL (calc) (ref 1.9–3.7)
Glucose, Bld: 93 mg/dL (ref 65–139)
Potassium: 4.4 mmol/L (ref 3.5–5.3)
Sodium: 140 mmol/L (ref 135–146)
Total Bilirubin: 1.2 mg/dL (ref 0.2–1.2)
Total Protein: 7.4 g/dL (ref 6.1–8.1)
eGFR: 98 mL/min/{1.73_m2} (ref >=60)

## 2021-03-14 LAB — CBC WITH DIFFERENTIAL/PLATELET
Absolute Monocytes: 605 cells/uL (ref 200–950)
Basophils Absolute: 82 cells/uL (ref 0–200)
Basophils Relative: 1.3 %
Eosinophils Absolute: 246 cells/uL (ref 15–500)
Eosinophils Relative: 3.9 %
HCT: 43.7 % (ref 35.0–45.0)
Hemoglobin: 14.7 g/dL (ref 11.7–15.5)
Lymphs Abs: 2577 cells/uL (ref 850–3900)
MCH: 29.5 pg (ref 27.0–33.0)
MCHC: 33.6 g/dL (ref 32.0–36.0)
MCV: 87.8 fL (ref 80.0–100.0)
MPV: 11.2 fL (ref 7.5–12.5)
Monocytes Relative: 9.6 %
Neutro Abs: 2791 cells/uL (ref 1500–7800)
Neutrophils Relative %: 44.3 %
Platelets: 247 10*3/uL (ref 140–400)
RBC: 4.98 10*6/uL (ref 3.80–5.10)
RDW: 13.2 % (ref 11.0–15.0)
Total Lymphocyte: 40.9 %
WBC: 6.3 10*3/uL (ref 3.8–10.8)

## 2021-03-14 LAB — TSH: TSH: 0.57 mIU/L (ref 0.40–4.50)

## 2021-03-14 LAB — HEMOGLOBIN A1C
Hgb A1c MFr Bld: 6.5 % of total Hgb — ABNORMAL HIGH (ref <5.7)
Mean Plasma Glucose: 140 mg/dL
eAG (mmol/L): 7.7 mmol/L

## 2021-03-15 NOTE — Progress Notes (Signed)
Monica Brooks,   Your A1C did increase into the diabetic range. We need to discuss a plan. We could do this in person or virtually.   CBC looks great.   Kidney, liver, glucose look great.   Your LDL, bad cholesterol, bumped up a little and not to the under 70 goal for diabetes. Are you taking the crestor daily?   Thyroid in normal range.

## 2021-03-20 DIAGNOSIS — R110 Nausea: Secondary | ICD-10-CM | POA: Diagnosis not present

## 2021-03-20 DIAGNOSIS — K5903 Drug induced constipation: Secondary | ICD-10-CM | POA: Diagnosis not present

## 2021-03-20 DIAGNOSIS — R197 Diarrhea, unspecified: Secondary | ICD-10-CM | POA: Diagnosis not present

## 2021-03-20 DIAGNOSIS — K219 Gastro-esophageal reflux disease without esophagitis: Secondary | ICD-10-CM | POA: Diagnosis not present

## 2021-03-23 ENCOUNTER — Other Ambulatory Visit: Payer: Self-pay | Admitting: *Deleted

## 2021-03-23 ENCOUNTER — Telehealth (INDEPENDENT_AMBULATORY_CARE_PROVIDER_SITE_OTHER): Payer: Federal, State, Local not specified - PPO | Admitting: Physician Assistant

## 2021-03-23 ENCOUNTER — Encounter: Payer: Self-pay | Admitting: Physician Assistant

## 2021-03-23 VITALS — Ht 70.0 in | Wt 175.0 lb

## 2021-03-23 DIAGNOSIS — I10 Essential (primary) hypertension: Secondary | ICD-10-CM

## 2021-03-23 DIAGNOSIS — E1165 Type 2 diabetes mellitus with hyperglycemia: Secondary | ICD-10-CM | POA: Diagnosis not present

## 2021-03-23 DIAGNOSIS — F17210 Nicotine dependence, cigarettes, uncomplicated: Secondary | ICD-10-CM

## 2021-03-23 MED ORDER — DAPAGLIFLOZIN PROPANEDIOL 10 MG PO TABS: 10.0000 mg | ORAL_TABLET | Freq: Every day | ORAL | 0 refills | Status: DC

## 2021-03-23 MED ORDER — EZETIMIBE 10 MG PO TABS: 10.0000 mg | ORAL_TABLET | Freq: Every day | ORAL | 0 refills | Status: DC

## 2021-03-23 NOTE — Progress Notes (Signed)
Discuss new diabetes diagnosis

## 2021-03-23 NOTE — Progress Notes (Signed)
Patient ID: Monica Brooks, female   DOB: 03-Aug-1960, 60 y.o.   MRN: [id number] .Marland KitchenVirtual Visit via Video Note  I connected with Monica Brooks on 03/23/21 at  3:40 PM EDT by a video enabled telemedicine application and verified that I am speaking with the correct person using two identifiers.  Location: Patient: home Provider: clinic  .Marland KitchenMarland KitchenParticipating in visit:  Patient: Monica Brooks Provider: Iran Planas PA-C Provider in training: Jari Favre PA-Student   I discussed the limitations of evaluation and management by telemedicine and the availability of in person appointments. The patient expressed understanding and agreed to proceed.  History of Present Illness: Pt is here to discuss labs and elevated A1C.   .. Active Ambulatory Problems    Diagnosis Date Noted   Hyperlipidemia 09/15/2007   Esophageal reflux 09/15/2007   IRRITABLE BOWEL SYNDROME, HX OF 09/15/2007   Hematemesis 11/04/2012   Syncope 11/04/2012   Stricture and stenosis of esophagus 11/04/2012   Lumbar disc herniation 10/08/2015   Migraine without aura and with status migrainosus, not intractable 10/08/2015   Primary osteoarthritis involving multiple joints 10/11/2015   Lumbar radiculopathy    Hematuria 04/23/2016   Low iron stores 04/25/2016   Elevated liver enzymes 04/25/2016   Current smoker 11/06/2017   Essential hypertension 10/30/2018   Emphysema lung ([place]) 12/27/2018   Pre-diabetes 68/08/2120   Periumbilical mass 48/25/0037   DDD (degenerative disc disease), lumbar 07/02/2019   Facet arthropathy 07/02/2019   Acute right-sided low back pain without sciatica 07/02/2019   Post laminectomy syndrome 07/02/2019   Sacroiliac joint pain 07/02/2019   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   Post-menopausal 02/13/2020   Chronic constipation 02/13/2020   Hyperplastic rectal polyp 04/08/2020   Grief at loss of child 02/17/2021   Nausea 02/17/2021   Type 2 diabetes mellitus with hyperglycemia, without long-term  current use of insulin ([place]) 03/23/2021   Resolved Ambulatory Problems    Diagnosis Date Noted   No Resolved Ambulatory Problems   Past Medical History:  Diagnosis Date   Allergy    Anemia    Anxiety    Arthritis    Chronic headaches    Colon polyps    Esophagitis 1992   GERD (gastroesophageal reflux disease)    HLD (hyperlipidemia)    HTN (hypertension)    IBS (irritable bowel syndrome)    Sliding hiatal hernia 1992   Stricture of esophagus 1992   Ulcer       Observations/Objective: No acute distress Normal mood and appearance.     Assessment and Plan: Marland KitchenMarland KitchenKorbin was seen today for diabetes.  Diagnoses and all orders for this visit:  Type 2 diabetes mellitus with hyperglycemia, without long-term current use of insulin (HCC)  Essential hypertension  Discussed new dx of DM.  Went over increased risk factors of CV events, neuropathy, kidney disease, retinopathy.  Discussed DM diet and exercise to control.  New LDL goal of under 70 and new BP goal of under 130/80.  Pt is on statin. Added zetia. She declined repatha.  Pt is on ACE.  Pt declined metformin due to GI side effects since she already struggles with GI issues.  Started farxiga. Discussed side effects.  Will get foot exam and pneumonia shot at next office visit.  Pt will need annual eye exam.  Strongly encouraged pt to stop smoking.  Follow up in 3 months    Follow Up Instructions:    I discussed the assessment and treatment plan with the patient. The patient was provided an  opportunity to ask questions and all were answered. The patient agreed with the plan and demonstrated an understanding of the instructions.   The patient was advised to call back or seek an in-person evaluation if the symptoms worsen or if the condition fails to improve as anticipated.    Iran Planas, PA-C

## 2021-03-25 DIAGNOSIS — K222 Esophageal obstruction: Secondary | ICD-10-CM | POA: Diagnosis not present

## 2021-03-25 DIAGNOSIS — R1319 Other dysphagia: Secondary | ICD-10-CM | POA: Diagnosis not present

## 2021-03-27 DIAGNOSIS — N83202 Unspecified ovarian cyst, left side: Secondary | ICD-10-CM | POA: Diagnosis not present

## 2021-03-27 DIAGNOSIS — N200 Calculus of kidney: Secondary | ICD-10-CM | POA: Diagnosis not present

## 2021-03-27 DIAGNOSIS — R109 Unspecified abdominal pain: Secondary | ICD-10-CM | POA: Diagnosis not present

## 2021-03-27 DIAGNOSIS — K5900 Constipation, unspecified: Secondary | ICD-10-CM | POA: Diagnosis not present

## 2021-03-27 DIAGNOSIS — R634 Abnormal weight loss: Secondary | ICD-10-CM | POA: Diagnosis not present

## 2021-04-02 ENCOUNTER — Other Ambulatory Visit: Payer: Self-pay

## 2021-04-02 ENCOUNTER — Ambulatory Visit (INDEPENDENT_AMBULATORY_CARE_PROVIDER_SITE_OTHER): Payer: Federal, State, Local not specified - PPO

## 2021-04-02 DIAGNOSIS — F17210 Nicotine dependence, cigarettes, uncomplicated: Secondary | ICD-10-CM | POA: Diagnosis not present

## 2021-04-06 ENCOUNTER — Other Ambulatory Visit: Payer: Self-pay | Admitting: Physician Assistant

## 2021-04-06 DIAGNOSIS — M5416 Radiculopathy, lumbar region: Secondary | ICD-10-CM

## 2021-04-06 DIAGNOSIS — M5126 Other intervertebral disc displacement, lumbar region: Secondary | ICD-10-CM

## 2021-04-17 ENCOUNTER — Other Ambulatory Visit: Payer: Self-pay | Admitting: Acute Care

## 2021-04-17 DIAGNOSIS — F17210 Nicotine dependence, cigarettes, uncomplicated: Secondary | ICD-10-CM

## 2021-04-17 DIAGNOSIS — Z87891 Personal history of nicotine dependence: Secondary | ICD-10-CM

## 2021-04-17 IMAGING — DX DG LUMBAR SPINE COMPLETE 4+V
5 series · 5 of 5 positions shown · non-contrast
Comparison: 12/29/2015

CLINICAL DATA: Low back pain worsening over the last month. Surgery
3 years ago.

EXAM:
LUMBAR SPINE - COMPLETE 4+ VIEW

[l-spine ap]
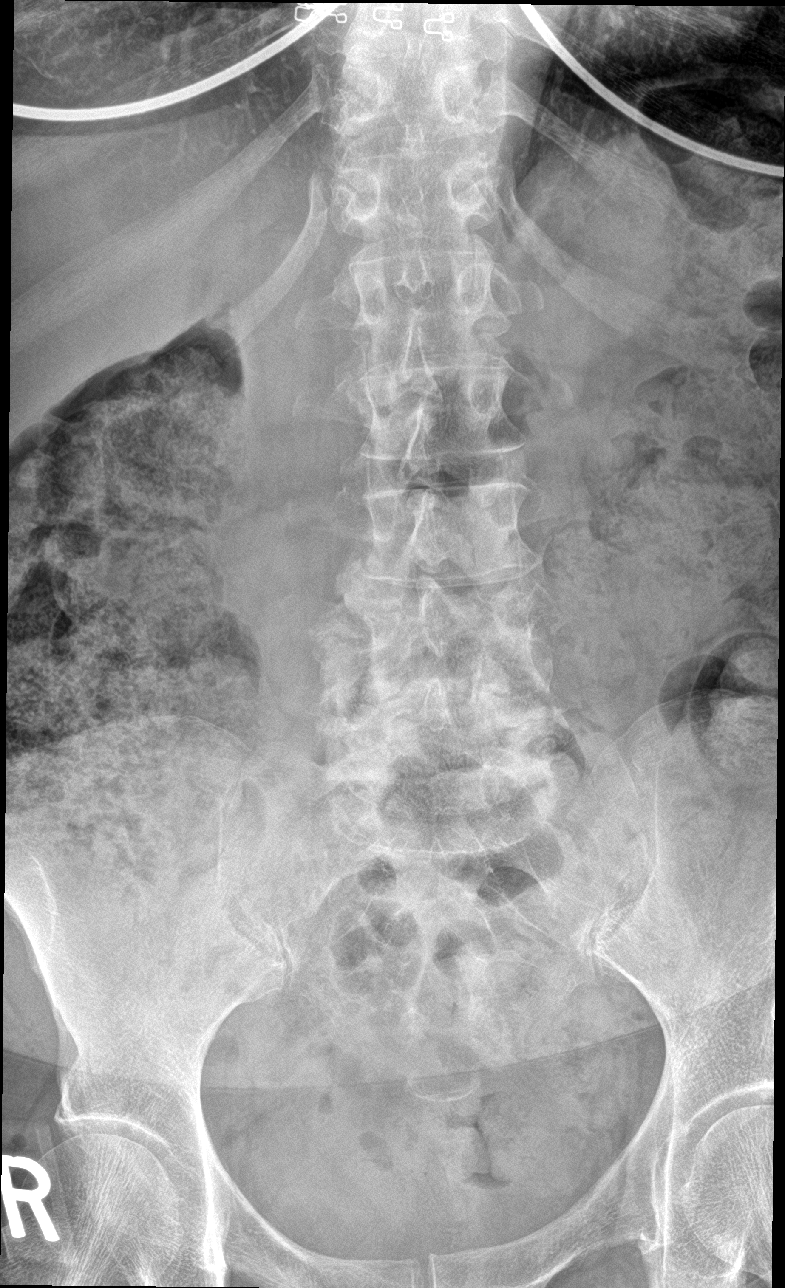

[l-spine obl (1 of 2)]
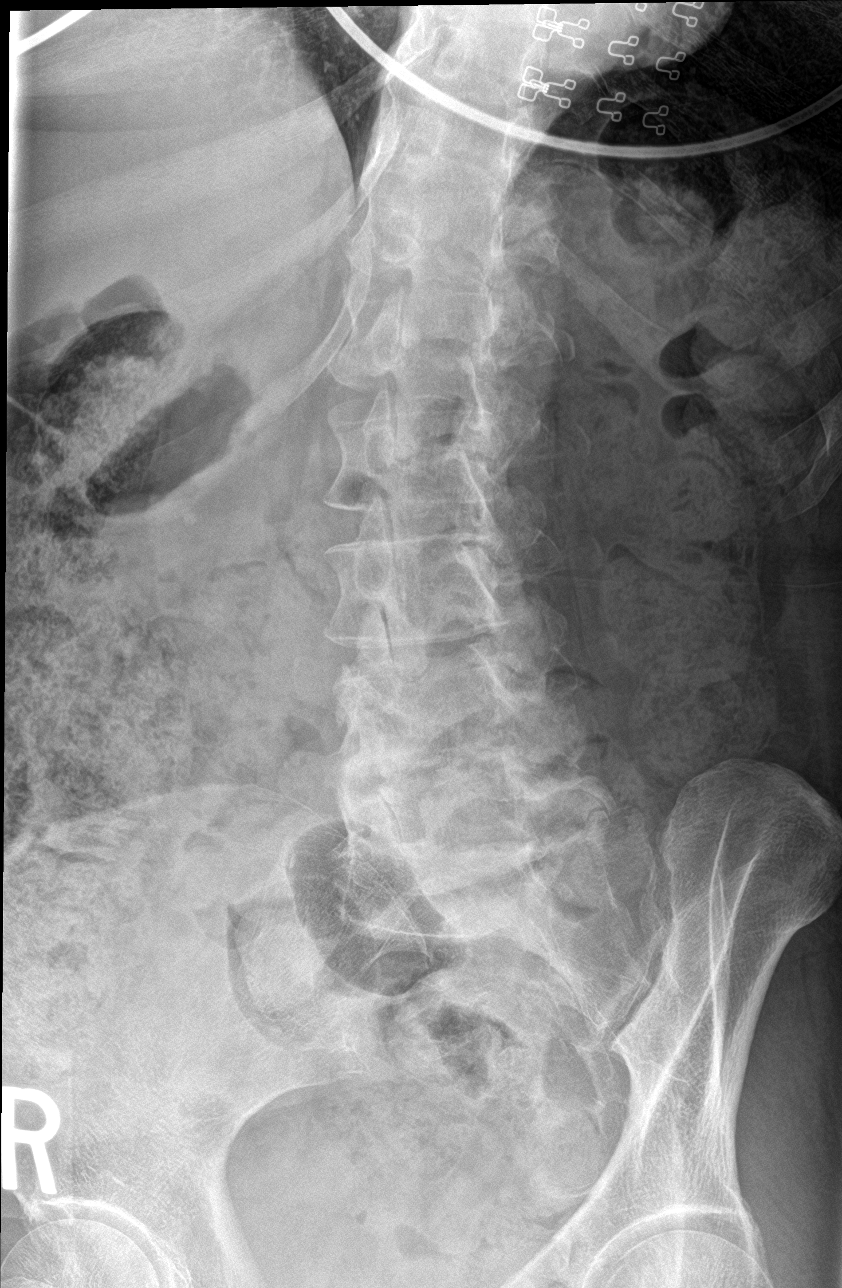

[l-spine obl (2 of 2)]
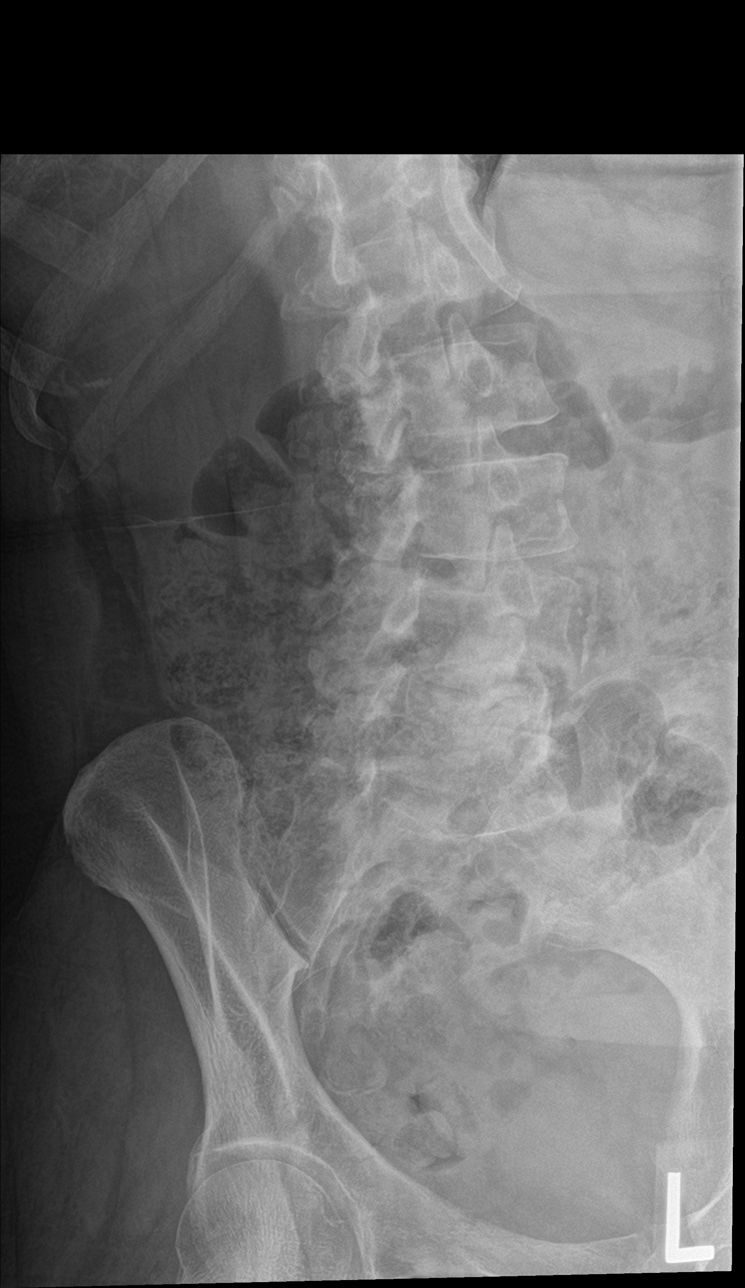

[l-spine lat]
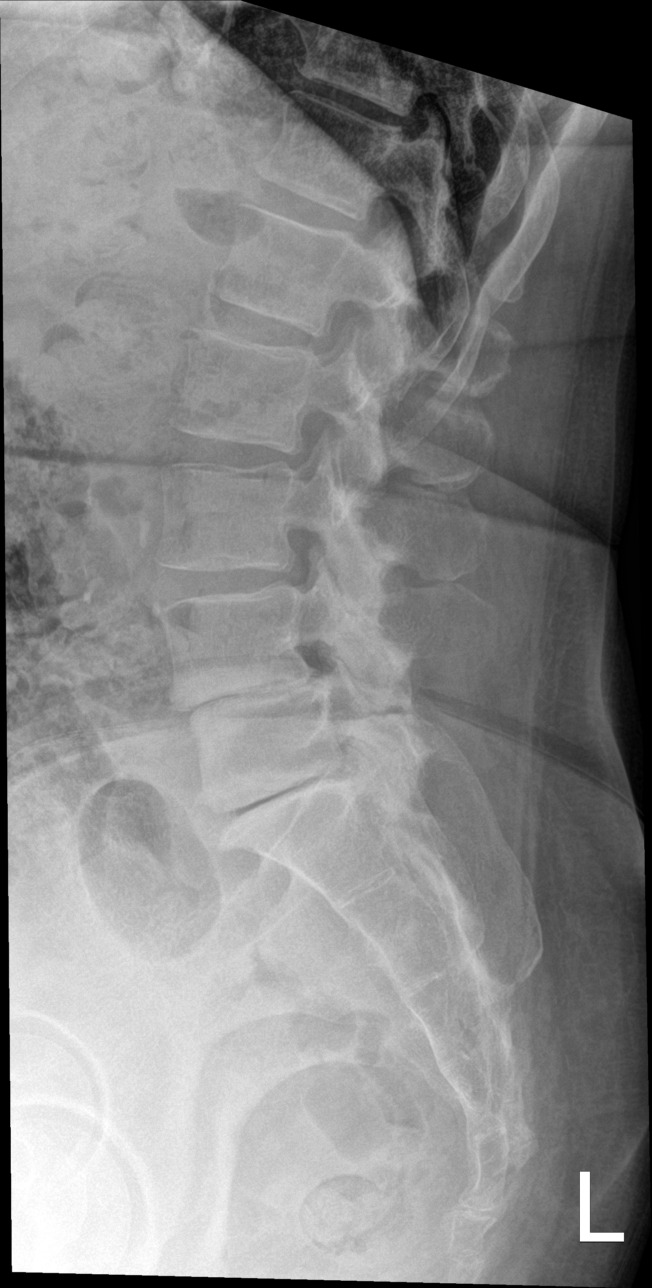

[l-spine spot]
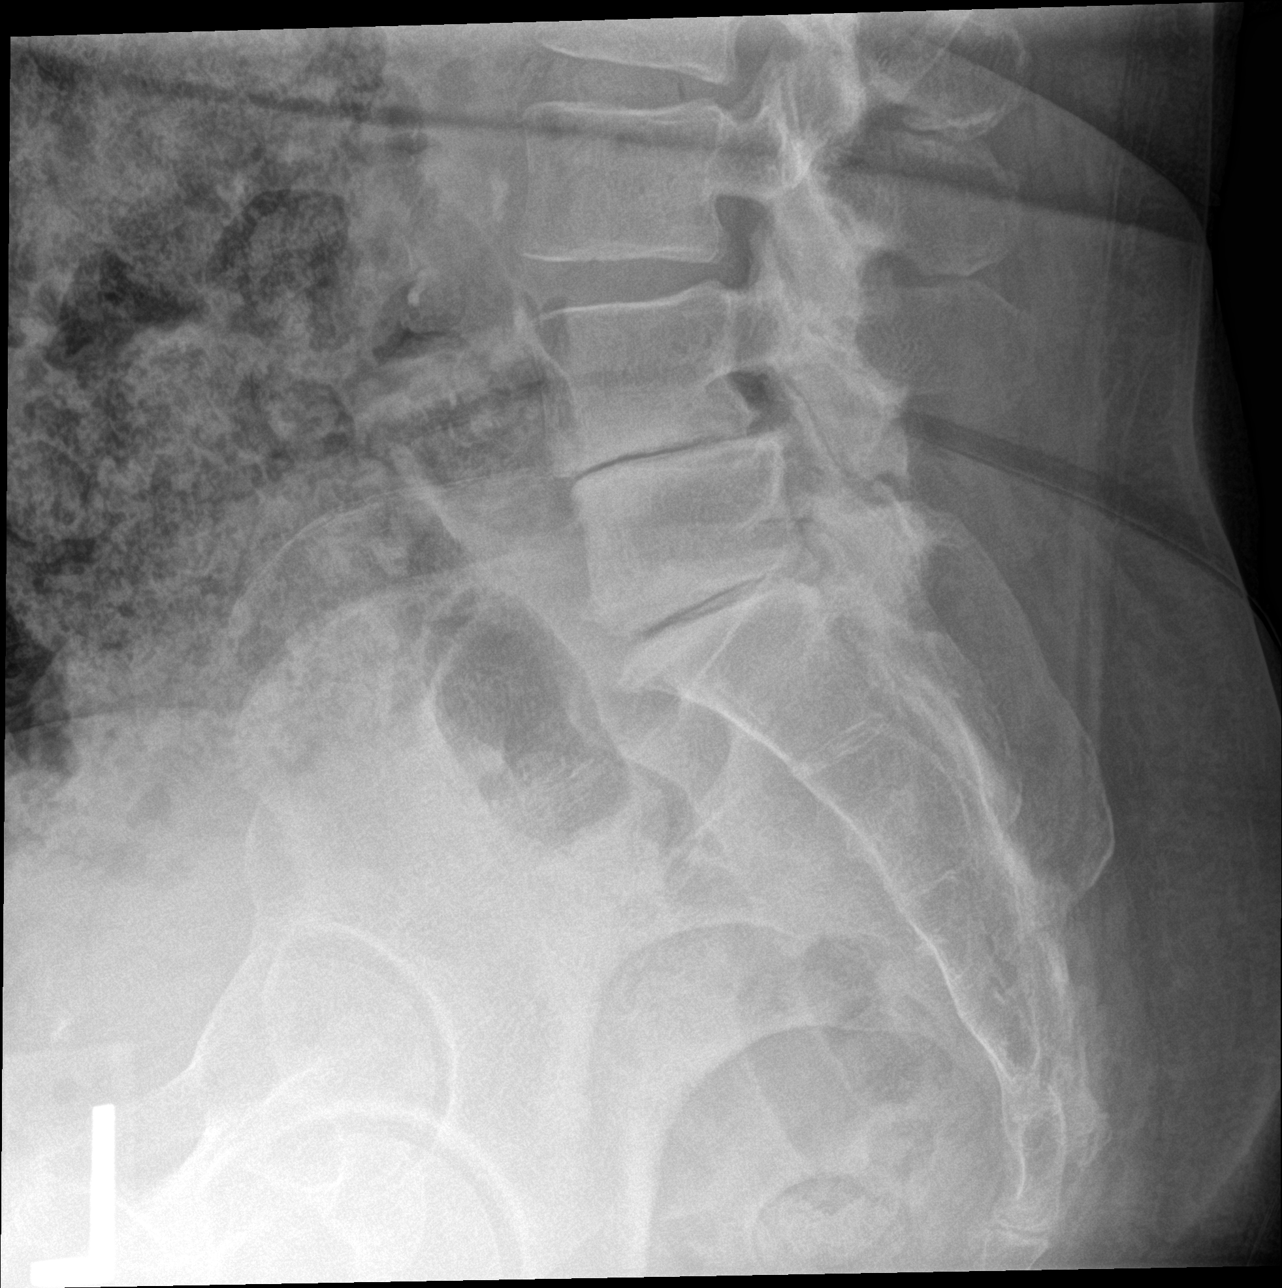

[5 of 5 positions shown; findings below may reference images not displayed]

FINDINGS: There are 5 nonrib bearing lumbar-type vertebral bodies.

The vertebral body heights are maintained. There is no acute
fracture.

There is grade 1 anterolisthesis of L4 on L5 secondary to facet
disease. There is no spondylolysis.

There is severe degenerative disease with disc height loss at L4-5
and L5-S1 with bilateral facet arthropathy.

The SI joints are unremarkable.

There is a large amount of stool throughout the colon.
IMPRESSION: 1. Degenerative disease with disc height loss and facet arthropathy
at L4-5 and L5-S1.
2. Grade 1 anterolisthesis of L4 on L5 secondary to facet disease.

## 2021-05-06 ENCOUNTER — Other Ambulatory Visit: Payer: Self-pay | Admitting: Physician Assistant

## 2021-05-06 DIAGNOSIS — E782 Mixed hyperlipidemia: Secondary | ICD-10-CM

## 2021-05-08 ENCOUNTER — Telehealth: Payer: Self-pay

## 2021-05-08 NOTE — Telephone Encounter (Signed)
Medication: dapagliflozin propanediol (FARXIGA) 10 MG TABS tablet Prior authorization submitted via CoverMyMeds on 05/08/2021 PA submission pending

## 2021-05-08 NOTE — Telephone Encounter (Signed)
Medication: dapagliflozin propanediol (FARXIGA) 10 MG TABS tablet Prior authorization determination received Medication has been approved Approval dates: 04/08/2021-05/08/2022  Patient aware via: [place] aware: Yes Provider aware via this encounter

## 2021-05-11 DIAGNOSIS — M47816 Spondylosis without myelopathy or radiculopathy, lumbar region: Secondary | ICD-10-CM | POA: Diagnosis not present

## 2021-05-11 DIAGNOSIS — M533 Sacrococcygeal disorders, not elsewhere classified: Secondary | ICD-10-CM | POA: Diagnosis not present

## 2021-05-11 DIAGNOSIS — G894 Chronic pain syndrome: Secondary | ICD-10-CM | POA: Diagnosis not present

## 2021-05-11 DIAGNOSIS — M961 Postlaminectomy syndrome, not elsewhere classified: Secondary | ICD-10-CM | POA: Diagnosis not present

## 2021-06-17 ENCOUNTER — Other Ambulatory Visit: Payer: Self-pay | Admitting: Physician Assistant

## 2021-06-19 DIAGNOSIS — R634 Abnormal weight loss: Secondary | ICD-10-CM | POA: Diagnosis not present

## 2021-06-19 DIAGNOSIS — R110 Nausea: Secondary | ICD-10-CM | POA: Diagnosis not present

## 2021-06-19 DIAGNOSIS — K219 Gastro-esophageal reflux disease without esophagitis: Secondary | ICD-10-CM | POA: Diagnosis not present

## 2021-06-19 DIAGNOSIS — R197 Diarrhea, unspecified: Secondary | ICD-10-CM | POA: Diagnosis not present

## 2021-07-16 DIAGNOSIS — Z823 Family history of stroke: Secondary | ICD-10-CM | POA: Diagnosis not present

## 2021-07-16 DIAGNOSIS — Z882 Allergy status to sulfonamides status: Secondary | ICD-10-CM | POA: Diagnosis not present

## 2021-07-16 DIAGNOSIS — K219 Gastro-esophageal reflux disease without esophagitis: Secondary | ICD-10-CM | POA: Diagnosis not present

## 2021-07-16 DIAGNOSIS — Z833 Family history of diabetes mellitus: Secondary | ICD-10-CM | POA: Diagnosis not present

## 2021-07-16 DIAGNOSIS — K3189 Other diseases of stomach and duodenum: Secondary | ICD-10-CM | POA: Diagnosis not present

## 2021-07-16 DIAGNOSIS — Z8249 Family history of ischemic heart disease and other diseases of the circulatory system: Secondary | ICD-10-CM | POA: Diagnosis not present

## 2021-07-16 DIAGNOSIS — E785 Hyperlipidemia, unspecified: Secondary | ICD-10-CM | POA: Diagnosis not present

## 2021-07-16 DIAGNOSIS — Z809 Family history of malignant neoplasm, unspecified: Secondary | ICD-10-CM | POA: Diagnosis not present

## 2021-07-16 DIAGNOSIS — Z79899 Other long term (current) drug therapy: Secondary | ICD-10-CM | POA: Diagnosis not present

## 2021-07-16 DIAGNOSIS — I10 Essential (primary) hypertension: Secondary | ICD-10-CM | POA: Diagnosis not present

## 2021-07-16 DIAGNOSIS — Z886 Allergy status to analgesic agent status: Secondary | ICD-10-CM | POA: Diagnosis not present

## 2021-07-16 DIAGNOSIS — Z87891 Personal history of nicotine dependence: Secondary | ICD-10-CM | POA: Diagnosis not present

## 2021-07-16 DIAGNOSIS — Z888 Allergy status to other drugs, medicaments and biological substances status: Secondary | ICD-10-CM | POA: Diagnosis not present

## 2021-07-16 DIAGNOSIS — E119 Type 2 diabetes mellitus without complications: Secondary | ICD-10-CM | POA: Diagnosis not present

## 2021-08-03 DIAGNOSIS — G894 Chronic pain syndrome: Secondary | ICD-10-CM | POA: Diagnosis not present

## 2021-08-03 DIAGNOSIS — Z79899 Other long term (current) drug therapy: Secondary | ICD-10-CM | POA: Diagnosis not present

## 2021-08-03 DIAGNOSIS — M961 Postlaminectomy syndrome, not elsewhere classified: Secondary | ICD-10-CM | POA: Diagnosis not present

## 2021-08-03 DIAGNOSIS — M533 Sacrococcygeal disorders, not elsewhere classified: Secondary | ICD-10-CM | POA: Diagnosis not present

## 2021-08-19 ENCOUNTER — Ambulatory Visit: Payer: Federal, State, Local not specified - PPO | Admitting: Physician Assistant

## 2021-08-19 DIAGNOSIS — E1165 Type 2 diabetes mellitus with hyperglycemia: Secondary | ICD-10-CM

## 2021-08-19 DIAGNOSIS — I10 Essential (primary) hypertension: Secondary | ICD-10-CM

## 2021-08-25 ENCOUNTER — Ambulatory Visit: Payer: Federal, State, Local not specified - PPO | Admitting: Physician Assistant

## 2021-08-25 DIAGNOSIS — I10 Essential (primary) hypertension: Secondary | ICD-10-CM

## 2021-08-25 DIAGNOSIS — E1165 Type 2 diabetes mellitus with hyperglycemia: Secondary | ICD-10-CM

## 2021-08-26 ENCOUNTER — Other Ambulatory Visit: Payer: Self-pay | Admitting: Physician Assistant

## 2021-08-26 DIAGNOSIS — I10 Essential (primary) hypertension: Secondary | ICD-10-CM

## 2021-09-01 ENCOUNTER — Encounter: Payer: Self-pay | Admitting: Physician Assistant

## 2021-09-01 DIAGNOSIS — I700 Atherosclerosis of aorta: Secondary | ICD-10-CM | POA: Insufficient documentation

## 2021-09-02 ENCOUNTER — Telehealth: Payer: Self-pay | Admitting: Physician Assistant

## 2021-09-02 DIAGNOSIS — I10 Essential (primary) hypertension: Secondary | ICD-10-CM

## 2021-09-02 MED ORDER — LISINOPRIL 10 MG PO TABS: ORAL_TABLET | ORAL | 0 refills | Status: DC

## 2021-09-02 MED ORDER — DAPAGLIFLOZIN PROPANEDIOL 10 MG PO TABS: 10.0000 mg | ORAL_TABLET | Freq: Every day | ORAL | 0 refills | Status: DC

## 2021-09-02 NOTE — Telephone Encounter (Signed)
Patient called for medication refill for lisinopril and farxiga. She has an appt on 4/3. ?

## 2021-09-02 NOTE — Telephone Encounter (Signed)
RX sent

## 2021-09-02 NOTE — Telephone Encounter (Signed)
Ok to send 30 days with no refills.

## 2021-09-16 DIAGNOSIS — M47816 Spondylosis without myelopathy or radiculopathy, lumbar region: Secondary | ICD-10-CM | POA: Diagnosis not present

## 2021-09-28 ENCOUNTER — Encounter: Payer: Self-pay | Admitting: Physician Assistant

## 2021-09-28 ENCOUNTER — Ambulatory Visit: Payer: Federal, State, Local not specified - PPO | Admitting: Physician Assistant

## 2021-09-28 VITALS — BP 128/81 | HR 73 | Resp 18 | Ht 70.0 in | Wt 185.0 lb

## 2021-09-28 DIAGNOSIS — M5126 Other intervertebral disc displacement, lumbar region: Secondary | ICD-10-CM

## 2021-09-28 DIAGNOSIS — E782 Mixed hyperlipidemia: Secondary | ICD-10-CM

## 2021-09-28 DIAGNOSIS — E1165 Type 2 diabetes mellitus with hyperglycemia: Secondary | ICD-10-CM | POA: Diagnosis not present

## 2021-09-28 DIAGNOSIS — M5416 Radiculopathy, lumbar region: Secondary | ICD-10-CM

## 2021-09-28 DIAGNOSIS — I10 Essential (primary) hypertension: Secondary | ICD-10-CM

## 2021-09-28 DIAGNOSIS — K2100 Gastro-esophageal reflux disease with esophagitis, without bleeding: Secondary | ICD-10-CM

## 2021-09-28 DIAGNOSIS — R110 Nausea: Secondary | ICD-10-CM

## 2021-09-28 DIAGNOSIS — G43009 Migraine without aura, not intractable, without status migrainosus: Secondary | ICD-10-CM

## 2021-09-28 LAB — POCT GLYCOSYLATED HEMOGLOBIN (HGB A1C): Hemoglobin A1C: 6.3 % — AB (ref 4.0–5.6)

## 2021-09-28 MED ORDER — EZETIMIBE 10 MG PO TABS: ORAL_TABLET | ORAL | 1 refills | Status: DC

## 2021-09-28 MED ORDER — CELECOXIB 200 MG PO CAPS: 200.0000 mg | ORAL_CAPSULE | Freq: Two times a day (BID) | ORAL | 3 refills | Status: DC

## 2021-09-28 MED ORDER — KETOROLAC TROMETHAMINE 60 MG/2ML IM SOLN
60.0000 mg | Freq: Once | INTRAMUSCULAR | Status: AC
  Administered 2021-09-28: 60 mg via INTRAMUSCULAR

## 2021-09-28 MED ORDER — ESOMEPRAZOLE MAGNESIUM 40 MG PO CPDR: DELAYED_RELEASE_CAPSULE | ORAL | 3 refills | Status: DC

## 2021-09-28 MED ORDER — PROMETHAZINE HCL 25 MG PO TABS: 25.0000 mg | ORAL_TABLET | ORAL | 5 refills | Status: DC | PRN

## 2021-09-28 MED ORDER — ROSUVASTATIN CALCIUM 40 MG PO TABS: ORAL_TABLET | ORAL | 1 refills | Status: DC

## 2021-09-28 MED ORDER — AMITRIPTYLINE HCL 100 MG PO TABS: 100.0000 mg | ORAL_TABLET | Freq: Every day | ORAL | 1 refills | Status: DC

## 2021-09-28 MED ORDER — DAPAGLIFLOZIN PROPANEDIOL 10 MG PO TABS
10.0000 mg | ORAL_TABLET | Freq: Every day | ORAL | 1 refills | Status: DC
Start: 2021-09-28 — End: 2022-03-29

## 2021-09-28 MED ORDER — PREGABALIN 150 MG PO CAPS: 300.0000 mg | ORAL_CAPSULE | Freq: Two times a day (BID) | ORAL | 1 refills | Status: DC

## 2021-09-28 MED ORDER — LISINOPRIL 10 MG PO TABS: ORAL_TABLET | ORAL | 1 refills | Status: DC

## 2021-09-28 NOTE — Progress Notes (Signed)
? ?Subjective:  ? ? Patient ID: Monica Brooks, female    DOB: 23-Jan-1961, 61 y.o.   MRN: [id number] ? ?HPI ?Pt is a 61 yo female with HTN, HLD, T2DM, Migraines, Emphysema, chronic lower back pain due to DDD and herniation and SI joint dysfunction who presents to the clinic for follow up.  ? ?Pt is not checking sugars regularly. She is taking farxiga daily. She is trying to stay active but pain does limit her. No hypoglycemic events. No open sores or wounds.  ? ?She sees pain clinic for oxycodone. She also taking celebrex, lyrica, amitriplyine and cymbalta. Toradol injections have helped her in the past and request one today.  ? ?Denies any worsening migraines, CP, palpitations, headaches or vision changes.  ? ?.. ?Active Ambulatory Problems  ?  Diagnosis Date Noted  ? Hyperlipidemia 09/15/2007  ? Esophageal reflux 09/15/2007  ? IRRITABLE BOWEL SYNDROME, HX OF 09/15/2007  ? Hematemesis 11/04/2012  ? Syncope 11/04/2012  ? Stricture and stenosis of esophagus 11/04/2012  ? Lumbar disc herniation 10/08/2015  ? Migraine without aura and with status migrainosus, not intractable 10/08/2015  ? Primary osteoarthritis involving multiple joints 10/11/2015  ? Lumbar radiculopathy   ? Hematuria 04/23/2016  ? Low iron stores 04/25/2016  ? Elevated liver enzymes 04/25/2016  ? Current smoker 11/06/2017  ? Essential hypertension 10/30/2018  ? Emphysema lung ([place]) 12/27/2018  ? Pre-diabetes 01/19/2019  ? Periumbilical mass 99/24/2683  ? DDD (degenerative disc disease), lumbar 07/02/2019  ? Facet arthropathy 07/02/2019  ? Acute right-sided low back pain without sciatica 07/02/2019  ? Post laminectomy syndrome 07/02/2019  ? Sacroiliac joint pain 07/02/2019  ? 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 Post-menopausal 02/13/2020  ? Chronic constipation 02/13/2020  ? Hyperplastic rectal polyp 04/08/2020  ? Grief at loss of child 02/17/2021  ? Nausea 02/17/2021  ? Type 2 diabetes mellitus with hyperglycemia, without long-term current use  of insulin ([place]) 03/23/2021  ? Aortic atherosclerosis ([place]) 09/01/2021  ? Migraine without aura and without status migrainosus, not intractable 10/05/2021  ? ?Resolved Ambulatory Problems  ?  Diagnosis Date Noted  ? No Resolved Ambulatory Problems  ? ?Past Medical History:  ?Diagnosis Date  ? Allergy   ? Anemia   ? Anxiety   ? Arthritis   ? Chronic headaches   ? Colon polyps   ? Esophagitis 1992  ? GERD (gastroesophageal reflux disease)   ? HLD (hyperlipidemia)   ? HTN (hypertension)   ? IBS (irritable bowel syndrome)   ? Sliding hiatal hernia 1992  ? Stricture of esophagus 1992  ? Ulcer   ? ? ? ? ? ?Review of Systems  ?All other systems reviewed and are negative. ? ?   ?Objective:  ? Physical Exam ?Vitals reviewed.  ?Constitutional:   ?   Appearance: Normal appearance. She is obese.  ?HENT:  ?   Head: Normocephalic.  ?Cardiovascular:  ?   Rate and Rhythm: Normal rate and regular rhythm.  ?   Pulses: Normal pulses.  ?   Heart sounds: Normal heart sounds.  ?Pulmonary:  ?   Effort: Pulmonary effort is normal.  ?   Breath sounds: Normal breath sounds.  ?Musculoskeletal:  ?   Right lower leg: No edema.  ?   Left lower leg: No edema.  ?Neurological:  ?   General: No focal deficit present.  ?   Mental Status: She is alert and oriented to person, place, and time.  ?Psychiatric:     ?   Mood  and Affect: Mood normal.  ? ? ? ? ?.. ?Results for orders placed or performed in visit on 09/28/21  ?POCT glycosylated hemoglobin (Hb A1C)  ?Result Value Ref Range  ? Hemoglobin A1C 6.3 (A) 4.0 - 5.6 %  ? HbA1c POC (<> result, manual entry)    ? HbA1c, POC (prediabetic range)    ? HbA1c, POC (controlled diabetic range)    ? ? ?   ?Assessment & Plan:  ?..Ginia was seen today for diabetes, hypertension and hip pain. ? ?Diagnoses and all orders for this visit: ? ?Type 2 diabetes mellitus with hyperglycemia, without long-term current use of insulin (HCC) ?-     POCT glycosylated hemoglobin (Hb A1C) ?-     COMPLETE METABOLIC PANEL WITH  GFR ?-     dapagliflozin propanediol (FARXIGA) 10 MG TABS tablet; Take 1 tablet (10 mg total) by mouth daily. ?-     Lipid Panel w/reflex Direct LDL ? ?Essential hypertension ?-     COMPLETE METABOLIC PANEL WITH GFR ?-     lisinopril (ZESTRIL) 10 MG tablet; TAKE 1 TABLET BY MOUTH DAILY/ NEEDS APPT ? ?Lumbar disc herniation ?-     amitriptyline (ELAVIL) 100 MG tablet; Take 1 tablet (100 mg total) by mouth at bedtime. ?-     celecoxib (CELEBREX) 200 MG capsule; Take 1 capsule (200 mg total) by mouth 2 (two) times daily. ?-     pregabalin (LYRICA) 150 MG capsule; Take 2 capsules (300 mg total) by mouth 2 (two) times daily. ?-     ketorolac (TORADOL) injection 60 mg ? ?Lumbar radiculopathy ?-     amitriptyline (ELAVIL) 100 MG tablet; Take 1 tablet (100 mg total) by mouth at bedtime. ?-     celecoxib (CELEBREX) 200 MG capsule; Take 1 capsule (200 mg total) by mouth 2 (two) times daily. ?-     pregabalin (LYRICA) 150 MG capsule; Take 2 capsules (300 mg total) by mouth 2 (two) times daily. ?-     ketorolac (TORADOL) injection 60 mg ? ?Gastroesophageal reflux disease with esophagitis, unspecified whether hemorrhage ?-     esomeprazole (NEXIUM) 40 MG capsule; TAKE ONE CAPSULE BY MOUTH TWICE DAILY BEFORE A MEAL ? ?Nausea ? ?Migraine without aura and without status migrainosus, not intractable ?-     promethazine (PHENERGAN) 25 MG tablet; Take 1 tablet (25 mg total) by mouth as needed for nausea or vomiting. ? ?Mixed hyperlipidemia ?-     ezetimibe (ZETIA) 10 MG tablet; TAKE 1 TABLET(10 MG) BY MOUTH DAILY ?-     rosuvastatin (CRESTOR) 40 MG tablet; TAKE 1 TABLET(40 MG) BY MOUTH DAILY ?-     Lipid Panel w/reflex Direct LDL ? ? ? ?Labs ordered today for follow up.  ?A1C to goal.  ?Continue on farxiga.  ?BP to goal.  ?On statin and zetia.  ?Declined foot exam.  ?Needs eye exam.  ?Covid vaccine x3.  ?Flu/shingrix/pneumonia UTD.  ?Follow up in 6 months due to controlled sugars.  ? ?For chronic pain.  ?Toradol '60mg'$  IM today.   ?Increased cymbalta to '60mg'$  daily.  ? ?

## 2021-09-29 DIAGNOSIS — M19072 Primary osteoarthritis, left ankle and foot: Secondary | ICD-10-CM | POA: Diagnosis not present

## 2021-09-29 DIAGNOSIS — G8929 Other chronic pain: Secondary | ICD-10-CM | POA: Diagnosis not present

## 2021-09-29 DIAGNOSIS — M93272 Osteochondritis dissecans, left ankle and joints of left foot: Secondary | ICD-10-CM | POA: Diagnosis not present

## 2021-09-29 DIAGNOSIS — M216X2 Other acquired deformities of left foot: Secondary | ICD-10-CM | POA: Diagnosis not present

## 2021-09-29 DIAGNOSIS — M7989 Other specified soft tissue disorders: Secondary | ICD-10-CM | POA: Diagnosis not present

## 2021-09-29 DIAGNOSIS — M25572 Pain in left ankle and joints of left foot: Secondary | ICD-10-CM | POA: Diagnosis not present

## 2021-09-29 DIAGNOSIS — Z9889 Other specified postprocedural states: Secondary | ICD-10-CM | POA: Diagnosis not present

## 2021-10-05 DIAGNOSIS — G43009 Migraine without aura, not intractable, without status migrainosus: Secondary | ICD-10-CM | POA: Insufficient documentation

## 2021-10-12 DIAGNOSIS — M93272 Osteochondritis dissecans, left ankle and joints of left foot: Secondary | ICD-10-CM | POA: Diagnosis not present

## 2021-10-12 DIAGNOSIS — S99912A Unspecified injury of left ankle, initial encounter: Secondary | ICD-10-CM | POA: Diagnosis not present

## 2021-10-15 ENCOUNTER — Telehealth: Payer: Self-pay

## 2021-10-15 NOTE — Telephone Encounter (Signed)
Initiated Prior authorization UZH:[id number] Magnesium '40MG'$  dr capsules ?Via: Covermymeds ?Case/Key:[id number] ?Status: approved  as of 10/15/21 ?Reason:The authorization is valid from 09/15/2021 through 10/15/2022.  ?Notified Pt via: Mychart ?

## 2021-10-26 DIAGNOSIS — M961 Postlaminectomy syndrome, not elsewhere classified: Secondary | ICD-10-CM | POA: Diagnosis not present

## 2021-10-26 DIAGNOSIS — M533 Sacrococcygeal disorders, not elsewhere classified: Secondary | ICD-10-CM | POA: Diagnosis not present

## 2021-10-26 DIAGNOSIS — M5416 Radiculopathy, lumbar region: Secondary | ICD-10-CM | POA: Diagnosis not present

## 2021-10-26 DIAGNOSIS — M47816 Spondylosis without myelopathy or radiculopathy, lumbar region: Secondary | ICD-10-CM | POA: Diagnosis not present

## 2021-10-29 DIAGNOSIS — M25372 Other instability, left ankle: Secondary | ICD-10-CM | POA: Diagnosis not present

## 2021-10-29 DIAGNOSIS — M2012 Hallux valgus (acquired), left foot: Secondary | ICD-10-CM | POA: Diagnosis not present

## 2021-10-29 DIAGNOSIS — S93492A Sprain of other ligament of left ankle, initial encounter: Secondary | ICD-10-CM | POA: Diagnosis not present

## 2021-10-29 DIAGNOSIS — M93272 Osteochondritis dissecans, left ankle and joints of left foot: Secondary | ICD-10-CM | POA: Diagnosis not present

## 2021-11-06 DIAGNOSIS — M533 Sacrococcygeal disorders, not elsewhere classified: Secondary | ICD-10-CM | POA: Diagnosis not present

## 2021-12-21 DIAGNOSIS — E1165 Type 2 diabetes mellitus with hyperglycemia: Secondary | ICD-10-CM | POA: Diagnosis not present

## 2021-12-21 DIAGNOSIS — E782 Mixed hyperlipidemia: Secondary | ICD-10-CM | POA: Diagnosis not present

## 2021-12-21 DIAGNOSIS — I10 Essential (primary) hypertension: Secondary | ICD-10-CM | POA: Diagnosis not present

## 2021-12-22 ENCOUNTER — Encounter: Payer: Self-pay | Admitting: Physician Assistant

## 2021-12-22 DIAGNOSIS — Z79899 Other long term (current) drug therapy: Secondary | ICD-10-CM | POA: Diagnosis not present

## 2021-12-22 DIAGNOSIS — M2042 Other hammer toe(s) (acquired), left foot: Secondary | ICD-10-CM | POA: Diagnosis not present

## 2021-12-22 DIAGNOSIS — X58XXXA Exposure to other specified factors, initial encounter: Secondary | ICD-10-CM | POA: Diagnosis not present

## 2021-12-22 DIAGNOSIS — M25372 Other instability, left ankle: Secondary | ICD-10-CM | POA: Diagnosis not present

## 2021-12-22 DIAGNOSIS — M7742 Metatarsalgia, left foot: Secondary | ICD-10-CM | POA: Diagnosis not present

## 2021-12-22 DIAGNOSIS — M2012 Hallux valgus (acquired), left foot: Secondary | ICD-10-CM | POA: Diagnosis not present

## 2021-12-22 DIAGNOSIS — S93492A Sprain of other ligament of left ankle, initial encounter: Secondary | ICD-10-CM | POA: Diagnosis not present

## 2021-12-22 DIAGNOSIS — E7849 Other hyperlipidemia: Secondary | ICD-10-CM | POA: Diagnosis not present

## 2021-12-22 DIAGNOSIS — M1990 Unspecified osteoarthritis, unspecified site: Secondary | ICD-10-CM | POA: Diagnosis not present

## 2021-12-22 DIAGNOSIS — K219 Gastro-esophageal reflux disease without esophagitis: Secondary | ICD-10-CM | POA: Diagnosis not present

## 2021-12-22 DIAGNOSIS — E119 Type 2 diabetes mellitus without complications: Secondary | ICD-10-CM | POA: Diagnosis not present

## 2021-12-22 DIAGNOSIS — M216X2 Other acquired deformities of left foot: Secondary | ICD-10-CM | POA: Diagnosis not present

## 2021-12-22 DIAGNOSIS — M93272 Osteochondritis dissecans, left ankle and joints of left foot: Secondary | ICD-10-CM | POA: Diagnosis not present

## 2021-12-22 DIAGNOSIS — Z87891 Personal history of nicotine dependence: Secondary | ICD-10-CM | POA: Diagnosis not present

## 2021-12-22 DIAGNOSIS — Z791 Long term (current) use of non-steroidal anti-inflammatories (NSAID): Secondary | ICD-10-CM | POA: Diagnosis not present

## 2021-12-22 LAB — COMPLETE METABOLIC PANEL WITH GFR
AG Ratio: 2.4 (calc) (ref 1.0–2.5)
ALT: 41 U/L — ABNORMAL HIGH (ref 6–29)
AST: 30 U/L (ref 10–35)
Albumin: 4.4 g/dL (ref 3.6–5.1)
Alkaline phosphatase (APISO): 69 U/L (ref 37–153)
BUN: 20 mg/dL (ref 7–25)
CO2: 28 mmol/L (ref 20–32)
Calcium: 9.2 mg/dL (ref 8.6–10.4)
Chloride: 104 mmol/L (ref 98–110)
Creat: 0.66 mg/dL (ref 0.50–1.05)
Globulin: 1.8 g/dL (calc) — ABNORMAL LOW (ref 1.9–3.7)
Glucose, Bld: 103 mg/dL — ABNORMAL HIGH (ref 65–99)
Potassium: 4 mmol/L (ref 3.5–5.3)
Sodium: 139 mmol/L (ref 135–146)
Total Bilirubin: 1 mg/dL (ref 0.2–1.2)
Total Protein: 6.2 g/dL (ref 6.1–8.1)
eGFR: 99 mL/min/{1.73_m2} (ref >=60)

## 2021-12-22 LAB — LIPID PANEL W/REFLEX DIRECT LDL
Cholesterol: 155 mg/dL (ref <200)
HDL: 76 mg/dL (ref >=50)
LDL Cholesterol (Calc): 65 mg/dL (calc)
Non-HDL Cholesterol (Calc): 79 mg/dL (calc) (ref <130)
Total CHOL/HDL Ratio: 2 (calc) (ref <5.0)
Triglycerides: 64 mg/dL (ref <150)

## 2022-01-05 DIAGNOSIS — S93492A Sprain of other ligament of left ankle, initial encounter: Secondary | ICD-10-CM | POA: Diagnosis not present

## 2022-01-05 DIAGNOSIS — M93272 Osteochondritis dissecans, left ankle and joints of left foot: Secondary | ICD-10-CM | POA: Diagnosis not present

## 2022-01-26 DIAGNOSIS — Z09 Encounter for follow-up examination after completed treatment for conditions other than malignant neoplasm: Secondary | ICD-10-CM | POA: Diagnosis not present

## 2022-01-26 DIAGNOSIS — S93492A Sprain of other ligament of left ankle, initial encounter: Secondary | ICD-10-CM | POA: Diagnosis not present

## 2022-01-27 ENCOUNTER — Encounter: Payer: Self-pay | Admitting: Neurology

## 2022-02-03 DIAGNOSIS — M25372 Other instability, left ankle: Secondary | ICD-10-CM | POA: Diagnosis not present

## 2022-02-03 DIAGNOSIS — M2042 Other hammer toe(s) (acquired), left foot: Secondary | ICD-10-CM | POA: Diagnosis not present

## 2022-02-03 DIAGNOSIS — M2012 Hallux valgus (acquired), left foot: Secondary | ICD-10-CM | POA: Diagnosis not present

## 2022-02-03 DIAGNOSIS — M93272 Osteochondritis dissecans, left ankle and joints of left foot: Secondary | ICD-10-CM | POA: Diagnosis not present

## 2022-02-08 DIAGNOSIS — M47816 Spondylosis without myelopathy or radiculopathy, lumbar region: Secondary | ICD-10-CM | POA: Diagnosis not present

## 2022-02-08 DIAGNOSIS — Z79899 Other long term (current) drug therapy: Secondary | ICD-10-CM | POA: Diagnosis not present

## 2022-02-08 DIAGNOSIS — M533 Sacrococcygeal disorders, not elsewhere classified: Secondary | ICD-10-CM | POA: Diagnosis not present

## 2022-02-08 DIAGNOSIS — M961 Postlaminectomy syndrome, not elsewhere classified: Secondary | ICD-10-CM | POA: Diagnosis not present

## 2022-02-09 DIAGNOSIS — M93272 Osteochondritis dissecans, left ankle and joints of left foot: Secondary | ICD-10-CM | POA: Diagnosis not present

## 2022-02-09 DIAGNOSIS — M2012 Hallux valgus (acquired), left foot: Secondary | ICD-10-CM | POA: Diagnosis not present

## 2022-02-09 DIAGNOSIS — M25372 Other instability, left ankle: Secondary | ICD-10-CM | POA: Diagnosis not present

## 2022-02-09 DIAGNOSIS — M2042 Other hammer toe(s) (acquired), left foot: Secondary | ICD-10-CM | POA: Diagnosis not present

## 2022-02-11 DIAGNOSIS — M2042 Other hammer toe(s) (acquired), left foot: Secondary | ICD-10-CM | POA: Diagnosis not present

## 2022-02-11 DIAGNOSIS — M2012 Hallux valgus (acquired), left foot: Secondary | ICD-10-CM | POA: Diagnosis not present

## 2022-02-11 DIAGNOSIS — M93272 Osteochondritis dissecans, left ankle and joints of left foot: Secondary | ICD-10-CM | POA: Diagnosis not present

## 2022-02-11 DIAGNOSIS — M25372 Other instability, left ankle: Secondary | ICD-10-CM | POA: Diagnosis not present

## 2022-02-16 DIAGNOSIS — M2012 Hallux valgus (acquired), left foot: Secondary | ICD-10-CM | POA: Diagnosis not present

## 2022-02-16 DIAGNOSIS — M25372 Other instability, left ankle: Secondary | ICD-10-CM | POA: Diagnosis not present

## 2022-02-16 DIAGNOSIS — M2042 Other hammer toe(s) (acquired), left foot: Secondary | ICD-10-CM | POA: Diagnosis not present

## 2022-02-16 DIAGNOSIS — M93272 Osteochondritis dissecans, left ankle and joints of left foot: Secondary | ICD-10-CM | POA: Diagnosis not present

## 2022-02-18 DIAGNOSIS — M2042 Other hammer toe(s) (acquired), left foot: Secondary | ICD-10-CM | POA: Diagnosis not present

## 2022-02-18 DIAGNOSIS — M25372 Other instability, left ankle: Secondary | ICD-10-CM | POA: Diagnosis not present

## 2022-02-18 DIAGNOSIS — M2012 Hallux valgus (acquired), left foot: Secondary | ICD-10-CM | POA: Diagnosis not present

## 2022-02-18 DIAGNOSIS — M93272 Osteochondritis dissecans, left ankle and joints of left foot: Secondary | ICD-10-CM | POA: Diagnosis not present

## 2022-02-23 DIAGNOSIS — M25372 Other instability, left ankle: Secondary | ICD-10-CM | POA: Diagnosis not present

## 2022-02-23 DIAGNOSIS — M2012 Hallux valgus (acquired), left foot: Secondary | ICD-10-CM | POA: Diagnosis not present

## 2022-02-23 DIAGNOSIS — M93272 Osteochondritis dissecans, left ankle and joints of left foot: Secondary | ICD-10-CM | POA: Diagnosis not present

## 2022-02-23 DIAGNOSIS — M2042 Other hammer toe(s) (acquired), left foot: Secondary | ICD-10-CM | POA: Diagnosis not present

## 2022-02-25 DIAGNOSIS — M93272 Osteochondritis dissecans, left ankle and joints of left foot: Secondary | ICD-10-CM | POA: Diagnosis not present

## 2022-02-25 DIAGNOSIS — M2012 Hallux valgus (acquired), left foot: Secondary | ICD-10-CM | POA: Diagnosis not present

## 2022-02-25 DIAGNOSIS — M25372 Other instability, left ankle: Secondary | ICD-10-CM | POA: Diagnosis not present

## 2022-02-25 DIAGNOSIS — M2042 Other hammer toe(s) (acquired), left foot: Secondary | ICD-10-CM | POA: Diagnosis not present

## 2022-03-02 DIAGNOSIS — M6281 Muscle weakness (generalized): Secondary | ICD-10-CM | POA: Diagnosis not present

## 2022-03-02 DIAGNOSIS — M25672 Stiffness of left ankle, not elsewhere classified: Secondary | ICD-10-CM | POA: Diagnosis not present

## 2022-03-02 DIAGNOSIS — Z4789 Encounter for other orthopedic aftercare: Secondary | ICD-10-CM | POA: Diagnosis not present

## 2022-03-02 DIAGNOSIS — M25572 Pain in left ankle and joints of left foot: Secondary | ICD-10-CM | POA: Diagnosis not present

## 2022-03-29 ENCOUNTER — Ambulatory Visit (INDEPENDENT_AMBULATORY_CARE_PROVIDER_SITE_OTHER): Payer: Federal, State, Local not specified - PPO | Admitting: Physician Assistant

## 2022-03-29 VITALS — BP 130/89 | HR 69 | Ht 70.0 in | Wt 189.0 lb

## 2022-03-29 DIAGNOSIS — E1165 Type 2 diabetes mellitus with hyperglycemia: Secondary | ICD-10-CM | POA: Diagnosis not present

## 2022-03-29 DIAGNOSIS — M5126 Other intervertebral disc displacement, lumbar region: Secondary | ICD-10-CM

## 2022-03-29 DIAGNOSIS — Z23 Encounter for immunization: Secondary | ICD-10-CM

## 2022-03-29 DIAGNOSIS — E782 Mixed hyperlipidemia: Secondary | ICD-10-CM

## 2022-03-29 DIAGNOSIS — K2100 Gastro-esophageal reflux disease with esophagitis, without bleeding: Secondary | ICD-10-CM | POA: Diagnosis not present

## 2022-03-29 DIAGNOSIS — I10 Essential (primary) hypertension: Secondary | ICD-10-CM | POA: Diagnosis not present

## 2022-03-29 DIAGNOSIS — M5416 Radiculopathy, lumbar region: Secondary | ICD-10-CM | POA: Diagnosis not present

## 2022-03-29 LAB — POCT GLYCOSYLATED HEMOGLOBIN (HGB A1C): HbA1c, POC (controlled diabetic range): 6.4 % (ref 0.0–7.0)

## 2022-03-29 LAB — POCT UA - MICROALBUMIN
Creatinine, POC: 50 mg/dL
Microalbumin Ur, POC: 10 mg/L

## 2022-03-29 MED ORDER — CELECOXIB 200 MG PO CAPS: 200.0000 mg | ORAL_CAPSULE | Freq: Two times a day (BID) | ORAL | 3 refills | Status: DC

## 2022-03-29 MED ORDER — ESOMEPRAZOLE MAGNESIUM 40 MG PO CPDR: DELAYED_RELEASE_CAPSULE | ORAL | 3 refills | Status: DC

## 2022-03-29 MED ORDER — AMITRIPTYLINE HCL 50 MG PO TABS: ORAL_TABLET | ORAL | 3 refills | Status: DC

## 2022-03-29 MED ORDER — KETOROLAC TROMETHAMINE 60 MG/2ML IM SOLN
60.0000 mg | Freq: Once | INTRAMUSCULAR | Status: AC
  Administered 2022-03-29: 60 mg via INTRAMUSCULAR

## 2022-03-29 MED ORDER — DULOXETINE HCL 60 MG PO CPEP: 60.0000 mg | ORAL_CAPSULE | Freq: Every day | ORAL | 3 refills | Status: DC

## 2022-03-29 MED ORDER — OZEMPIC (1 MG/DOSE) 4 MG/3ML ~~LOC~~ SOPN: 0.5000 mg | PEN_INJECTOR | SUBCUTANEOUS | 0 refills | Status: DC

## 2022-03-29 MED ORDER — ROSUVASTATIN CALCIUM 40 MG PO TABS: ORAL_TABLET | ORAL | 1 refills | Status: DC

## 2022-03-29 MED ORDER — PREGABALIN 150 MG PO CAPS: 150.0000 mg | ORAL_CAPSULE | Freq: Two times a day (BID) | ORAL | 1 refills | Status: DC

## 2022-03-29 MED ORDER — OZEMPIC (1 MG/DOSE) 4 MG/3ML ~~LOC~~ SOPN: 0.2500 mg | PEN_INJECTOR | SUBCUTANEOUS | 0 refills | Status: DC

## 2022-03-29 MED ORDER — EZETIMIBE 10 MG PO TABS: ORAL_TABLET | ORAL | 1 refills | Status: DC

## 2022-03-29 MED ORDER — DAPAGLIFLOZIN PROPANEDIOL 10 MG PO TABS: 10.0000 mg | ORAL_TABLET | Freq: Every day | ORAL | 1 refills | Status: DC

## 2022-03-29 MED ORDER — LISINOPRIL 10 MG PO TABS: ORAL_TABLET | ORAL | 1 refills | Status: DC

## 2022-03-29 NOTE — Progress Notes (Signed)
Established Patient Office Visit  Subjective   Patient ID: Monica Brooks, female    DOB: 03-01-1961  Age: 61 y.o. MRN: [id number]  Chief Complaint  Patient presents with   Follow-up   Diabetes    HPI Pt is a 61 yo female with T2DM, migraines, HTN, HLD, chronic low back pain post laminectomy who presents to the clinic for medication follow up.   She is not checking her sugars. No CP, palpitations, headaches, vision changes. No open sores or wounds. She is taking farxiga. She is trying to exercise more but chronic pain makes it hard.   Her pain is fairly well controlled. No concerns or complaints.   .. Active Ambulatory Problems    Diagnosis Date Noted   Hyperlipidemia 09/15/2007   Esophageal reflux 09/15/2007   IRRITABLE BOWEL SYNDROME, HX OF 09/15/2007   Hematemesis 11/04/2012   Syncope 11/04/2012   Stricture and stenosis of esophagus 11/04/2012   Lumbar disc herniation 10/08/2015   Migraine without aura and with status migrainosus, not intractable 10/08/2015   Primary osteoarthritis involving multiple joints 10/11/2015   Lumbar radiculopathy    Hematuria 04/23/2016   Low iron stores 04/25/2016   Elevated liver enzymes 04/25/2016   Current smoker 11/06/2017   Essential hypertension 10/30/2018   Emphysema lung ([place]) 12/27/2018   Pre-diabetes 76/72/0947   Periumbilical mass 09/62/8366   DDD (degenerative disc disease), lumbar 07/02/2019   Facet arthropathy 07/02/2019   Acute right-sided low back pain without sciatica 07/02/2019   Post laminectomy syndrome 07/02/2019   Sacroiliac joint pain 07/02/2019   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   Post-menopausal 02/13/2020   Chronic constipation 02/13/2020   Hyperplastic rectal polyp 04/08/2020   Grief at loss of child 02/17/2021   Nausea 02/17/2021   Type 2 diabetes mellitus with hyperglycemia, without long-term current use of insulin ([place]) 03/23/2021   Aortic atherosclerosis ([place]) 09/01/2021   Migraine without  aura and without status migrainosus, not intractable 10/05/2021   Resolved Ambulatory Problems    Diagnosis Date Noted   No Resolved Ambulatory Problems   Past Medical History:  Diagnosis Date   Allergy    Anemia    Anxiety    Arthritis    Chronic headaches    Colon polyps    Esophagitis 1992   GERD (gastroesophageal reflux disease)    HLD (hyperlipidemia)    HTN (hypertension)    IBS (irritable bowel syndrome)    Sliding hiatal hernia 1992   Stricture of esophagus 1992   Ulcer     ROS See HPI.    Objective:     BP 130/89   Pulse 69   Ht '5\' 10"'$  (1.778 m)   Wt 189 lb (85.7 kg)   SpO2 96%   BMI 27.12 kg/m  BP Readings from Last 3 Encounters:  03/29/22 130/89  09/28/21 128/81  02/16/21 128/86   Wt Readings from Last 3 Encounters:  03/29/22 189 lb (85.7 kg)  09/28/21 185 lb (83.9 kg)  03/23/21 175 lb (79.4 kg)    .Marland Kitchen    03/29/2022    3:52 PM 03/29/2022    3:44 PM 02/16/2021    4:16 PM 02/13/2020    8:03 AM 11/01/2017    2:07 PM  Depression screen PHQ 2/9  Decreased Interest 0 0 1 0 0  Down, Depressed, Hopeless 0 0 1 0 0  PHQ - 2 Score 0 0 2 0 0  Altered sleeping 0  2    Tired, decreased energy 2  1  Change in appetite 0  1    Feeling bad or failure about yourself  0  0    Trouble concentrating 0  1    Moving slowly or fidgety/restless 0  0    Suicidal thoughts 0  0    PHQ-9 Score 2  7    Difficult doing work/chores Somewhat difficult  Somewhat difficult     .Marland Kitchen    03/29/2022    3:53 PM 02/16/2021    4:17 PM  GAD 7 : Generalized Anxiety Score  Nervous, Anxious, on Edge 0 0  Control/stop worrying 0 2  Worry too much - different things 0 1  Trouble relaxing 0 0  Restless 0 0  Easily annoyed or irritable 0 0  Afraid - awful might happen 0 1  Total GAD 7 Score 0 4  Anxiety Difficulty Not difficult at all Somewhat difficult      Physical Exam Constitutional:      Appearance: Normal appearance.  HENT:     Head: Normocephalic.  Cardiovascular:      Rate and Rhythm: Normal rate and regular rhythm.  Pulmonary:     Effort: Pulmonary effort is normal.     Breath sounds: Normal breath sounds.  Musculoskeletal:     Right lower leg: No edema.     Left lower leg: No edema.  Neurological:     General: No focal deficit present.     Mental Status: She is alert and oriented to person, place, and time.  Psychiatric:        Mood and Affect: Mood normal.      Results for orders placed or performed in visit on 03/29/22  POCT glycosylated hemoglobin (Hb A1C)  Result Value Ref Range   Hemoglobin A1C     HbA1c POC (<> result, manual entry)     HbA1c, POC (prediabetic range)     HbA1c, POC (controlled diabetic range) 6.4 0.0 - 7.0 %  POCT UA - Microalbumin  Result Value Ref Range   Microalbumin Ur, POC 10 mg/L   Creatinine, POC 50 mg/dL   Albumin/Creatinine Ratio, Urine, POC 30-300        Assessment & Plan:  Marland KitchenMarland KitchenTequita was seen today for follow-up and diabetes.  Diagnoses and all orders for this visit:  Type 2 diabetes mellitus with hyperglycemia, without long-term current use of insulin (HCC) -     POCT glycosylated hemoglobin (Hb A1C) -     POCT UA - Microalbumin -     dapagliflozin propanediol (FARXIGA) 10 MG TABS tablet; Take 1 tablet (10 mg total) by mouth daily. -     Semaglutide, 1 MG/DOSE, (OZEMPIC, 1 MG/DOSE,) 4 MG/3ML SOPN; Inject 0.25 mg into the skin once a week. -     Semaglutide, 1 MG/DOSE, (OZEMPIC, 1 MG/DOSE,) 4 MG/3ML SOPN; Inject 0.5 mg into the skin once a week.  Flu vaccine need -     Flu Vaccine QUAD 26moIM (Fluarix, Fluzone & Alfiuria Quad PF)  Essential hypertension -     lisinopril (ZESTRIL) 10 MG tablet; TAKE 1 TABLET BY MOUTH DAILY.  Gastroesophageal reflux disease with esophagitis, unspecified whether hemorrhage -     esomeprazole (NEXIUM) 40 MG capsule; TAKE ONE CAPSULE BY MOUTH TWICE DAILY BEFORE A MEAL  Lumbar disc herniation -     DULoxetine (CYMBALTA) 60 MG capsule; Take 1 capsule (60 mg  total) by mouth daily. -     celecoxib (CELEBREX) 200 MG capsule; Take 1 capsule (200 mg total) by mouth  2 (two) times daily. -     amitriptyline (ELAVIL) 50 MG tablet; Take one tablet at bedtime. -     pregabalin (LYRICA) 150 MG capsule; Take 1 capsule (150 mg total) by mouth 2 (two) times daily. -     ketorolac (TORADOL) injection 60 mg  Lumbar radiculopathy -     DULoxetine (CYMBALTA) 60 MG capsule; Take 1 capsule (60 mg total) by mouth daily. -     celecoxib (CELEBREX) 200 MG capsule; Take 1 capsule (200 mg total) by mouth 2 (two) times daily. -     amitriptyline (ELAVIL) 50 MG tablet; Take one tablet at bedtime. -     pregabalin (LYRICA) 150 MG capsule; Take 1 capsule (150 mg total) by mouth 2 (two) times daily. -     ketorolac (TORADOL) injection 60 mg  Mixed hyperlipidemia -     rosuvastatin (CRESTOR) 40 MG tablet; TAKE 1 TABLET(40 MG) BY MOUTH DAILY -     ezetimibe (ZETIA) 10 MG tablet; TAKE 1 TABLET(10 MG) BY MOUTH DAILY   A1C to goal Continue on farxiga Added ozempic for better glycemic control and weight loss Discussed side effects and how to take injection On statin and zetia BP to goal-on ACE UA with some protein-on farxiga Will get labs today Foot and eye exam UTD Flu shot given today Discussed pneumonia vaccine and need for it Follow up in 3 months  Discussed chronic pain Changed up lyrica to '150mg'$  twice a day Toradol IM '60mg'$  injection given  Follow up in 3 months.      Iran Planas, PA-C

## 2022-03-29 NOTE — Patient Instructions (Signed)
Start ozempic .'25mg'$  weekly for 1 month then increase to .'5mg'$  weekly.

## 2022-03-30 ENCOUNTER — Encounter: Payer: Self-pay | Admitting: Physician Assistant

## 2022-04-02 ENCOUNTER — Ambulatory Visit: Payer: Federal, State, Local not specified - PPO

## 2022-04-08 ENCOUNTER — Other Ambulatory Visit: Payer: Self-pay | Admitting: Physician Assistant

## 2022-04-08 ENCOUNTER — Ambulatory Visit (INDEPENDENT_AMBULATORY_CARE_PROVIDER_SITE_OTHER): Payer: Federal, State, Local not specified - PPO

## 2022-04-08 DIAGNOSIS — Z122 Encounter for screening for malignant neoplasm of respiratory organs: Secondary | ICD-10-CM

## 2022-04-08 DIAGNOSIS — Z87891 Personal history of nicotine dependence: Secondary | ICD-10-CM

## 2022-04-08 DIAGNOSIS — F17210 Nicotine dependence, cigarettes, uncomplicated: Secondary | ICD-10-CM

## 2022-04-08 DIAGNOSIS — Z1231 Encounter for screening mammogram for malignant neoplasm of breast: Secondary | ICD-10-CM

## 2022-04-12 ENCOUNTER — Other Ambulatory Visit: Payer: Self-pay | Admitting: Acute Care

## 2022-04-12 DIAGNOSIS — Z87891 Personal history of nicotine dependence: Secondary | ICD-10-CM

## 2022-04-12 DIAGNOSIS — F17210 Nicotine dependence, cigarettes, uncomplicated: Secondary | ICD-10-CM

## 2022-04-12 DIAGNOSIS — Z122 Encounter for screening for malignant neoplasm of respiratory organs: Secondary | ICD-10-CM

## 2022-04-15 ENCOUNTER — Ambulatory Visit: Payer: Federal, State, Local not specified - PPO

## 2022-04-27 DIAGNOSIS — Z09 Encounter for follow-up examination after completed treatment for conditions other than malignant neoplasm: Secondary | ICD-10-CM | POA: Diagnosis not present

## 2022-04-27 DIAGNOSIS — M93272 Osteochondritis dissecans, left ankle and joints of left foot: Secondary | ICD-10-CM | POA: Diagnosis not present

## 2022-04-27 DIAGNOSIS — S93492A Sprain of other ligament of left ankle, initial encounter: Secondary | ICD-10-CM | POA: Diagnosis not present

## 2022-04-27 DIAGNOSIS — M25372 Other instability, left ankle: Secondary | ICD-10-CM | POA: Diagnosis not present

## 2022-04-29 ENCOUNTER — Ambulatory Visit: Payer: Federal, State, Local not specified - PPO

## 2022-05-06 ENCOUNTER — Ambulatory Visit: Payer: Federal, State, Local not specified - PPO

## 2022-05-06 DIAGNOSIS — E119 Type 2 diabetes mellitus without complications: Secondary | ICD-10-CM | POA: Diagnosis not present

## 2022-05-06 DIAGNOSIS — M2042 Other hammer toe(s) (acquired), left foot: Secondary | ICD-10-CM | POA: Diagnosis not present

## 2022-05-06 DIAGNOSIS — E785 Hyperlipidemia, unspecified: Secondary | ICD-10-CM | POA: Diagnosis not present

## 2022-05-06 DIAGNOSIS — Z882 Allergy status to sulfonamides status: Secondary | ICD-10-CM | POA: Diagnosis not present

## 2022-05-06 DIAGNOSIS — I10 Essential (primary) hypertension: Secondary | ICD-10-CM | POA: Diagnosis not present

## 2022-05-06 DIAGNOSIS — Z886 Allergy status to analgesic agent status: Secondary | ICD-10-CM | POA: Diagnosis not present

## 2022-05-06 DIAGNOSIS — Z79899 Other long term (current) drug therapy: Secondary | ICD-10-CM | POA: Diagnosis not present

## 2022-05-10 DIAGNOSIS — M961 Postlaminectomy syndrome, not elsewhere classified: Secondary | ICD-10-CM | POA: Diagnosis not present

## 2022-05-10 DIAGNOSIS — G894 Chronic pain syndrome: Secondary | ICD-10-CM | POA: Diagnosis not present

## 2022-05-10 DIAGNOSIS — M533 Sacrococcygeal disorders, not elsewhere classified: Secondary | ICD-10-CM | POA: Diagnosis not present

## 2022-05-19 ENCOUNTER — Telehealth: Payer: Self-pay | Admitting: Neurology

## 2022-05-19 NOTE — Telephone Encounter (Signed)
-----   Message from Donella Stade, [place] sent at 05/19/2022 11:02 AM EST ----- More than happy to schedule a telephone call:   Some plaque accumulation in the thoracic aorta-on crestor which helps risk, walking daily 30 minutes a day, ASA '81mg'$  a day, keep BP down, stay smoke free, and avoid diabetes also will help.   Lungs show some nodules that appear benign and emphysema/bronchitis changes. Follow up for any cough or shortness of breath.   ----- Message ----- From: Christie Beckers, RN Sent: 04/12/2022   8:35 AM EST To: Donella Stade, PA-C

## 2022-05-19 NOTE — Telephone Encounter (Signed)
Patient has reviewed results and recommendations via mychart.

## 2022-05-27 ENCOUNTER — Ambulatory Visit (INDEPENDENT_AMBULATORY_CARE_PROVIDER_SITE_OTHER): Payer: Federal, State, Local not specified - PPO

## 2022-05-27 DIAGNOSIS — Z1231 Encounter for screening mammogram for malignant neoplasm of breast: Secondary | ICD-10-CM

## 2022-06-02 NOTE — Progress Notes (Signed)
Please call patient. Normal mammogram.  Repeat in 1 year.  

## 2022-07-02 DIAGNOSIS — G43919 Migraine, unspecified, intractable, without status migrainosus: Secondary | ICD-10-CM | POA: Insufficient documentation

## 2022-07-02 DIAGNOSIS — M1990 Unspecified osteoarthritis, unspecified site: Secondary | ICD-10-CM | POA: Insufficient documentation

## 2022-07-05 ENCOUNTER — Ambulatory Visit: Payer: Federal, State, Local not specified - PPO | Admitting: Physician Assistant

## 2022-07-14 ENCOUNTER — Ambulatory Visit: Payer: Federal, State, Local not specified - PPO | Admitting: Physician Assistant

## 2022-07-14 VITALS — BP 128/76 | HR 63 | Ht 70.0 in | Wt 191.0 lb

## 2022-07-14 DIAGNOSIS — E1165 Type 2 diabetes mellitus with hyperglycemia: Secondary | ICD-10-CM | POA: Diagnosis not present

## 2022-07-14 DIAGNOSIS — I10 Essential (primary) hypertension: Secondary | ICD-10-CM | POA: Diagnosis not present

## 2022-07-14 DIAGNOSIS — E782 Mixed hyperlipidemia: Secondary | ICD-10-CM | POA: Diagnosis not present

## 2022-07-14 LAB — POCT GLYCOSYLATED HEMOGLOBIN (HGB A1C): Hemoglobin A1C: 7.3 % — AB (ref 4.0–5.6)

## 2022-07-14 MED ORDER — SEMAGLUTIDE (1 MG/DOSE) 4 MG/3ML ~~LOC~~ SOPN: 1.0000 mg | PEN_INJECTOR | SUBCUTANEOUS | 1 refills | Status: DC

## 2022-07-14 MED ORDER — OZEMPIC (0.25 OR 0.5 MG/DOSE) 2 MG/3ML ~~LOC~~ SOPN: 0.5000 mg | PEN_INJECTOR | SUBCUTANEOUS | 0 refills | Status: DC

## 2022-07-14 NOTE — Progress Notes (Signed)
Established Patient Office Visit  Subjective   Patient ID: Monica Brooks, female    DOB: 1960-10-07  Age: 62 y.o. MRN: [id number]  Chief Complaint  Patient presents with   Follow-up   Diabetes    HPI Pt is a 62 yo female with T2DM, HTN, HLD who presents to the clinic for follow up.   She is doing ok. She does not check her sugars. She admits to not eating very healthy over the past 3 months. She did not start ozempic. She is only on farxiga. No CP, palpitations, headaches or vision changes.    .. Active Ambulatory Problems    Diagnosis Date Noted   Hyperlipidemia 09/15/2007   Esophageal reflux 09/15/2007   IRRITABLE BOWEL SYNDROME, HX OF 09/15/2007   Hematemesis 11/04/2012   Syncope 11/04/2012   Stricture and stenosis of esophagus 11/04/2012   Lumbar disc herniation 10/08/2015   Migraine without aura and with status migrainosus, not intractable 10/08/2015   Primary osteoarthritis involving multiple joints 10/11/2015   Lumbar radiculopathy    Hematuria 04/23/2016   Low iron stores 04/25/2016   Elevated liver enzymes 04/25/2016   Current smoker 11/06/2017   Essential hypertension 10/30/2018   Emphysema lung ([place]) 12/27/2018   Pre-diabetes 94/85/4627   Periumbilical mass 03/50/0938   DDD (degenerative disc disease), lumbar 07/02/2019   Facet arthropathy 07/02/2019   Acute right-sided low back pain without sciatica 07/02/2019   Post laminectomy syndrome 07/02/2019   Sacroiliac joint pain 07/02/2019   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   Post-menopausal 02/13/2020   Chronic constipation 02/13/2020   Hyperplastic rectal polyp 04/08/2020   Grief at loss of child 02/17/2021   Nausea 02/17/2021   Type 2 diabetes mellitus with hyperglycemia, without long-term current use of insulin ([place]) 03/23/2021   Aortic atherosclerosis ([place]) 09/01/2021   Migraine without aura and without status migrainosus, not intractable 10/05/2021   Arthritis 07/02/2022   Refractory migraine  with aura 07/02/2022   Resolved Ambulatory Problems    Diagnosis Date Noted   No Resolved Ambulatory Problems   Past Medical History:  Diagnosis Date   Allergy    Anemia    Anxiety    Chronic headaches    Colon polyps    Esophagitis 1992   GERD (gastroesophageal reflux disease)    HLD (hyperlipidemia)    HTN (hypertension)    IBS (irritable bowel syndrome)    Sliding hiatal hernia 1992   Stricture of esophagus 1992   Ulcer      Review of Systems  All other systems reviewed and are negative.     Objective:     BP 128/76   Pulse 63   Ht '5\' 10"'$  (1.778 m)   Wt 191 lb (86.6 kg)   SpO2 99%   BMI 27.41 kg/m  BP Readings from Last 3 Encounters:  07/14/22 128/76  03/29/22 130/89  09/28/21 128/81   Wt Readings from Last 3 Encounters:  07/14/22 191 lb (86.6 kg)  03/29/22 189 lb (85.7 kg)  09/28/21 185 lb (83.9 kg)    .Marland Kitchen Results for orders placed or performed in visit on 07/14/22  POCT glycosylated hemoglobin (Hb A1C)  Result Value Ref Range   Hemoglobin A1C 7.3 (A) 4.0 - 5.6 %   HbA1c POC (<> result, manual entry)     HbA1c, POC (prediabetic range)     HbA1c, POC (controlled diabetic range)       Physical Exam Constitutional:      Appearance: Normal appearance.  HENT:  Head: Normocephalic.  Cardiovascular:     Rate and Rhythm: Normal rate and regular rhythm.  Pulmonary:     Effort: Pulmonary effort is normal.     Breath sounds: Normal breath sounds.  Neurological:     General: No focal deficit present.     Mental Status: She is alert and oriented to person, place, and time.  Psychiatric:        Mood and Affect: Mood normal.        The 10-year ASCVD risk score (Arnett DK, et al., 2019) is: 13.2%    Assessment & Plan:  Marland KitchenMarland KitchenSemiyah was seen today for follow-up and diabetes.  Diagnoses and all orders for this visit:  Type 2 diabetes mellitus with hyperglycemia, without long-term current use of insulin (HCC) -     POCT glycosylated hemoglobin  (Hb A1C) -     Semaglutide,0.25 or 0.'5MG'$ /DOS, (OZEMPIC, 0.25 OR 0.5 MG/DOSE,) 2 MG/3ML SOPN; Inject 0.5 mg into the skin every 14 (fourteen) days. -     Semaglutide, 1 MG/DOSE, 4 MG/3ML SOPN; Inject 1 mg as directed once a week.  Essential hypertension  Mixed hyperlipidemia   A1C not to goal Start ozempic  .'5mg'$  and then increase to '1mg'$  Continue on farxiga BP to goal On statin Vaccines UTD Follow up in 3 months   Iran Planas, PA-C

## 2022-07-16 ENCOUNTER — Encounter: Payer: Self-pay | Admitting: Physician Assistant

## 2022-07-21 DIAGNOSIS — M533 Sacrococcygeal disorders, not elsewhere classified: Secondary | ICD-10-CM | POA: Diagnosis not present

## 2022-08-16 DIAGNOSIS — M961 Postlaminectomy syndrome, not elsewhere classified: Secondary | ICD-10-CM | POA: Diagnosis not present

## 2022-08-16 DIAGNOSIS — M533 Sacrococcygeal disorders, not elsewhere classified: Secondary | ICD-10-CM | POA: Diagnosis not present

## 2022-08-16 DIAGNOSIS — Z79899 Other long term (current) drug therapy: Secondary | ICD-10-CM | POA: Diagnosis not present

## 2022-08-16 DIAGNOSIS — M5416 Radiculopathy, lumbar region: Secondary | ICD-10-CM | POA: Diagnosis not present

## 2022-09-21 ENCOUNTER — Other Ambulatory Visit: Payer: Self-pay | Admitting: Physician Assistant

## 2022-09-21 DIAGNOSIS — E1165 Type 2 diabetes mellitus with hyperglycemia: Secondary | ICD-10-CM

## 2022-10-13 ENCOUNTER — Ambulatory Visit: Payer: Federal, State, Local not specified - PPO | Admitting: Physician Assistant

## 2022-11-24 ENCOUNTER — Other Ambulatory Visit: Payer: Self-pay | Admitting: Physician Assistant

## 2022-11-24 DIAGNOSIS — E782 Mixed hyperlipidemia: Secondary | ICD-10-CM

## 2022-11-26 ENCOUNTER — Other Ambulatory Visit: Payer: Self-pay | Admitting: Physician Assistant

## 2022-11-26 DIAGNOSIS — E782 Mixed hyperlipidemia: Secondary | ICD-10-CM

## 2022-12-07 ENCOUNTER — Encounter: Payer: Self-pay | Admitting: Physician Assistant

## 2022-12-07 ENCOUNTER — Ambulatory Visit: Payer: Federal, State, Local not specified - PPO | Admitting: Physician Assistant

## 2022-12-07 VITALS — BP 140/82 | HR 95 | Ht 70.0 in | Wt 177.0 lb

## 2022-12-07 DIAGNOSIS — M5416 Radiculopathy, lumbar region: Secondary | ICD-10-CM | POA: Diagnosis not present

## 2022-12-07 DIAGNOSIS — E782 Mixed hyperlipidemia: Secondary | ICD-10-CM

## 2022-12-07 DIAGNOSIS — Z7984 Long term (current) use of oral hypoglycemic drugs: Secondary | ICD-10-CM

## 2022-12-07 DIAGNOSIS — K2100 Gastro-esophageal reflux disease with esophagitis, without bleeding: Secondary | ICD-10-CM | POA: Diagnosis not present

## 2022-12-07 DIAGNOSIS — I10 Essential (primary) hypertension: Secondary | ICD-10-CM

## 2022-12-07 DIAGNOSIS — E785 Hyperlipidemia, unspecified: Secondary | ICD-10-CM

## 2022-12-07 DIAGNOSIS — E1165 Type 2 diabetes mellitus with hyperglycemia: Secondary | ICD-10-CM | POA: Diagnosis not present

## 2022-12-07 DIAGNOSIS — R110 Nausea: Secondary | ICD-10-CM

## 2022-12-07 DIAGNOSIS — M961 Postlaminectomy syndrome, not elsewhere classified: Secondary | ICD-10-CM | POA: Diagnosis not present

## 2022-12-07 DIAGNOSIS — M533 Sacrococcygeal disorders, not elsewhere classified: Secondary | ICD-10-CM | POA: Diagnosis not present

## 2022-12-07 DIAGNOSIS — M5126 Other intervertebral disc displacement, lumbar region: Secondary | ICD-10-CM

## 2022-12-07 LAB — POCT GLYCOSYLATED HEMOGLOBIN (HGB A1C): Hemoglobin A1C: 6.9 % — AB (ref 4.0–5.6)

## 2022-12-07 MED ORDER — DULOXETINE HCL 60 MG PO CPEP
60.0000 mg | ORAL_CAPSULE | Freq: Every day | ORAL | 3 refills | Status: AC
Start: 2022-12-07 — End: ?

## 2022-12-07 MED ORDER — SEMAGLUTIDE (1 MG/DOSE) 4 MG/3ML ~~LOC~~ SOPN
1.0000 mg | PEN_INJECTOR | SUBCUTANEOUS | 1 refills | Status: DC
Start: 2022-12-07 — End: 2022-12-07

## 2022-12-07 MED ORDER — PREGABALIN 150 MG PO CAPS: 150.0000 mg | ORAL_CAPSULE | Freq: Two times a day (BID) | ORAL | 0 refills | Status: DC

## 2022-12-07 MED ORDER — DAPAGLIFLOZIN PROPANEDIOL 10 MG PO TABS
10.0000 mg | ORAL_TABLET | Freq: Every day | ORAL | 1 refills | Status: AC
Start: 2022-12-07 — End: ?

## 2022-12-07 MED ORDER — CELECOXIB 200 MG PO CAPS
200.0000 mg | ORAL_CAPSULE | Freq: Two times a day (BID) | ORAL | 3 refills | Status: AC
Start: 2022-12-07 — End: ?

## 2022-12-07 MED ORDER — ESOMEPRAZOLE MAGNESIUM 40 MG PO CPDR: DELAYED_RELEASE_CAPSULE | ORAL | 3 refills | Status: AC

## 2022-12-07 MED ORDER — PREGABALIN 150 MG PO CAPS
150.0000 mg | ORAL_CAPSULE | Freq: Two times a day (BID) | ORAL | 1 refills | Status: AC
Start: 2022-12-07 — End: ?

## 2022-12-07 MED ORDER — TIZANIDINE HCL 4 MG PO CAPS
4.0000 mg | ORAL_CAPSULE | ORAL | 1 refills | Status: AC | PRN
Start: 2022-12-07 — End: ?

## 2022-12-07 MED ORDER — EZETIMIBE 10 MG PO TABS
ORAL_TABLET | ORAL | 1 refills | Status: AC
Start: 2022-12-07 — End: ?

## 2022-12-07 MED ORDER — IBUPROFEN 800 MG PO TABS
ORAL_TABLET | ORAL | 1 refills | Status: AC
Start: 2022-12-07 — End: ?

## 2022-12-07 MED ORDER — SEMAGLUTIDE (1 MG/DOSE) 4 MG/3ML ~~LOC~~ SOPN
1.0000 mg | PEN_INJECTOR | SUBCUTANEOUS | 1 refills | Status: AC
Start: 2022-12-07 — End: ?

## 2022-12-07 MED ORDER — ONDANSETRON HCL 8 MG PO TABS
8.0000 mg | ORAL_TABLET | Freq: Three times a day (TID) | ORAL | 3 refills | Status: DC | PRN
Start: 2022-12-07 — End: 2023-03-15

## 2022-12-07 MED ORDER — AMITRIPTYLINE HCL 50 MG PO TABS
ORAL_TABLET | ORAL | 3 refills | Status: AC
Start: 2022-12-07 — End: ?

## 2022-12-07 MED ORDER — ROSUVASTATIN CALCIUM 40 MG PO TABS
ORAL_TABLET | ORAL | 1 refills | Status: AC
Start: 2022-12-07 — End: ?

## 2022-12-07 MED ORDER — LISINOPRIL 10 MG PO TABS
ORAL_TABLET | ORAL | 1 refills | Status: AC
Start: 2022-12-07 — End: ?

## 2022-12-07 NOTE — Progress Notes (Unsigned)
Established Patient Office Visit  Subjective   Patient ID: ABBIEGAIL Brooks, female    DOB: 1961/04/19  Age: 62 y.o. MRN: [id number]  Chief Complaint  Patient presents with   Follow-up   Diabetes    HPI Pt is a 62 yo female with T2DM, HTN, HLD, chronic pain due to lumbar DDD and radiculopathy who presents to the clinic for 3 month follow up.   She is doing well overall. She is not checking her sugars. She is compliant on ozempic and farxiga. No hypoglycemic events. No open sores or wounds.   Pain is controlled by pain clinic and pt is on oxycodone, cymbalta, lyrica, celebrex, tizanidine. Ibuprofen is only used for HAs.       Patient Active Problem List   Diagnosis Date Noted   Arthritis 07/02/2022   Refractory migraine with aura 07/02/2022   Migraine without aura and without status migrainosus, not intractable 10/05/2021   Aortic atherosclerosis (HCC) 09/01/2021   Type 2 diabetes mellitus with hyperglycemia, without long-term current use of insulin (HCC) 03/23/2021   Grief at loss of child 02/17/2021   Nausea 02/17/2021   Hyperplastic rectal polyp 04/08/2020   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   Post-menopausal 02/13/2020   Chronic constipation 02/13/2020   DDD (degenerative disc disease), lumbar 07/02/2019   Facet arthropathy 07/02/2019   Acute right-sided low back pain without sciatica 07/02/2019   Post laminectomy syndrome 07/02/2019   Sacroiliac joint pain 07/02/2019   Periumbilical mass 06/28/2019   Pre-diabetes 01/19/2019   Emphysema lung (HCC) 12/27/2018   Essential hypertension 10/30/2018   Current smoker 11/06/2017   Low iron stores 04/25/2016   Elevated liver enzymes 04/25/2016   Hematuria 04/23/2016   Lumbar radiculopathy    Primary osteoarthritis involving multiple joints 10/11/2015   Lumbar disc herniation 10/08/2015   Migraine without aura and with status migrainosus, not intractable 10/08/2015   Hematemesis 11/04/2012   Syncope 11/04/2012    Stricture and stenosis of esophagus 11/04/2012   Hyperlipidemia 09/15/2007   Esophageal reflux 09/15/2007   IRRITABLE BOWEL SYNDROME, HX OF 09/15/2007   Past Medical History:  Diagnosis Date   Allergy    Anemia    Anxiety    Arthritis    Chronic headaches    Colon polyps    Esophagitis 1992   EGD---Dr. Corinda Gubler    GERD (gastroesophageal reflux disease)    HLD (hyperlipidemia)    HTN (hypertension)    no meds   IBS (irritable bowel syndrome)    Sliding hiatal hernia 1992   EGD---Dr. [place]    Stricture of esophagus 1992   EGD---Dr. Corinda Gubler    Ulcer    Past Surgical History:  Procedure Laterality Date   ANKLE FRACTURE SURGERY Left    BREAST CYST EXCISION Right    BUNIONECTOMY Left    with tendon release   KNEE ARTHROSCOPY Left    TOTAL ABDOMINAL HYSTERECTOMY     Family History  Problem Relation Age of Onset   Breast cancer Mother    Hypertension Mother    Prostate cancer Father    Colon polyps Father    Hypertension Father    Stroke Father    Colon cancer Maternal Grandmother    Diabetes Maternal Grandmother    Diabetes Sister    Hyperlipidemia Sister    Hypertension Sister    Diabetes Maternal Aunt    Esophageal cancer Neg Hx    Stomach cancer Neg Hx    Rectal cancer Neg Hx    Allergies  Allergen Reactions   Aspirin Nausea Only    Upset stomach   Amoxicillin-Pot Clavulanate Rash   Sulfa Antibiotics Itching and Rash      ROS See HPI.    Objective:     BP (!) 140/82   Pulse 95   Ht 5\' 10"  (1.778 m)   Wt 177 lb (80.3 kg)   SpO2 96%   BMI 25.40 kg/m  BP Readings from Last 3 Encounters:  12/07/22 (!) 140/82  07/14/22 128/76  03/29/22 130/89   Wt Readings from Last 3 Encounters:  12/07/22 177 lb (80.3 kg)  07/14/22 191 lb (86.6 kg)  03/29/22 189 lb (85.7 kg)    .Marland Kitchen Results for orders placed or performed in visit on 12/07/22  POCT HgB A1C  Result Value Ref Range   Hemoglobin A1C 6.9 (A) 4.0 - 5.6 %   HbA1c POC (<> result, manual entry)      HbA1c, POC (prediabetic range)     HbA1c, POC (controlled diabetic range)       Physical Exam Constitutional:      Appearance: Normal appearance.  HENT:     Head: Normocephalic.  Neck:     Vascular: No carotid bruit.  Cardiovascular:     Rate and Rhythm: Normal rate and regular rhythm.  Pulmonary:     Effort: Pulmonary effort is normal.     Breath sounds: Normal breath sounds.  Musculoskeletal:     Cervical back: Neck supple. No rigidity or tenderness.     Right lower leg: No edema.     Left lower leg: No edema.  Lymphadenopathy:     Cervical: No cervical adenopathy.  Neurological:     General: No focal deficit present.     Mental Status: She is alert and oriented to person, place, and time.  Psychiatric:        Mood and Affect: Mood normal.        The 10-year ASCVD risk score (Arnett DK, et al., 2019) is: 16.5%    Assessment & Plan:  Marland KitchenMarland KitchenJiselle was seen today for follow-up and diabetes.  Diagnoses and all orders for this visit:  Type 2 diabetes mellitus with hyperglycemia, without long-term current use of insulin (HCC) -     POCT HgB A1C -     Lipid Panel w/reflex Direct LDL -     COMPLETE METABOLIC PANEL WITH GFR -     TSH -     CBC w/Diff/Platelet -     dapagliflozin propanediol (FARXIGA) 10 MG TABS tablet; Take 1 tablet (10 mg total) by mouth daily. -     Discontinue: Semaglutide, 1 MG/DOSE, 4 MG/3ML SOPN; Inject 1 mg as directed once a week. -     Semaglutide, 1 MG/DOSE, 4 MG/3ML SOPN; Inject 1 mg as directed once a week.  Gastroesophageal reflux disease with esophagitis, unspecified whether hemorrhage -     esomeprazole (NEXIUM) 40 MG capsule; TAKE ONE CAPSULE BY MOUTH TWICE DAILY BEFORE A MEAL  Lumbar radiculopathy -     tiZANidine (ZANAFLEX) 4 MG capsule; Take 1 capsule (4 mg total) by mouth as needed for muscle spasms. -     celecoxib (CELEBREX) 200 MG capsule; Take 1 capsule (200 mg total) by mouth 2 (two) times daily. -     ibuprofen (ADVIL) 800  MG tablet; TAKE 1 TABLET(800 MG) BY MOUTH EVERY 8 HOURS AS NEEDED -     DULoxetine (CYMBALTA) 60 MG capsule; Take 1 capsule (60 mg total) by mouth daily. -  amitriptyline (ELAVIL) 50 MG tablet; Take one tablet at bedtime. -     pregabalin (LYRICA) 150 MG capsule; Take 1 capsule (150 mg total) by mouth 2 (two) times daily.  Lumbar disc herniation -     tiZANidine (ZANAFLEX) 4 MG capsule; Take 1 capsule (4 mg total) by mouth as needed for muscle spasms. -     celecoxib (CELEBREX) 200 MG capsule; Take 1 capsule (200 mg total) by mouth 2 (two) times daily. -     ibuprofen (ADVIL) 800 MG tablet; TAKE 1 TABLET(800 MG) BY MOUTH EVERY 8 HOURS AS NEEDED -     DULoxetine (CYMBALTA) 60 MG capsule; Take 1 capsule (60 mg total) by mouth daily. -     amitriptyline (ELAVIL) 50 MG tablet; Take one tablet at bedtime. -     pregabalin (LYRICA) 150 MG capsule; Take 1 capsule (150 mg total) by mouth 2 (two) times daily.  Essential hypertension -     COMPLETE METABOLIC PANEL WITH GFR -     lisinopril (ZESTRIL) 10 MG tablet; TAKE 1 TABLET BY MOUTH DAILY.  Hyperlipidemia LDL goal <70 -     Lipid Panel w/reflex Direct LDL -     ezetimibe (ZETIA) 10 MG tablet; TAKE 1 TABLET(10 MG) BY MOUTH DAILY -     rosuvastatin (CRESTOR) 40 MG tablet; TAKE 1 TABLET BY MOUTH EVERY DAY  Nausea -     ondansetron (ZOFRAN) 8 MG tablet; Take 1 tablet (8 mg total) by mouth every 8 (eight) hours as needed for nausea or vomiting.  Mixed hyperlipidemia -     Lipid Panel w/reflex Direct LDL -     ezetimibe (ZETIA) 10 MG tablet; TAKE 1 TABLET(10 MG) BY MOUTH DAILY -     rosuvastatin (CRESTOR) 40 MG tablet; TAKE 1 TABLET BY MOUTH EVERY DAY  Other orders -     Discontinue: pregabalin (LYRICA) 150 MG capsule; Take 1 capsule (150 mg total) by mouth 2 (two) times daily.   A1C is under 7 and to goal Stay on same medication BP not to goal, continue on ACE work on better BP management On STATIN Foot exam declined Needs yearly eye  exam Vaccines UTD Follow up in 3 months.   Continue with medications to control pain.    Return in about 3 months (around 03/09/2023).    Tandy Gaw, PA-C

## 2022-12-10 ENCOUNTER — Other Ambulatory Visit: Payer: Self-pay | Admitting: Physician Assistant

## 2022-12-10 DIAGNOSIS — I10 Essential (primary) hypertension: Secondary | ICD-10-CM | POA: Diagnosis not present

## 2022-12-10 DIAGNOSIS — E1165 Type 2 diabetes mellitus with hyperglycemia: Secondary | ICD-10-CM | POA: Diagnosis not present

## 2022-12-10 DIAGNOSIS — E785 Hyperlipidemia, unspecified: Secondary | ICD-10-CM | POA: Diagnosis not present

## 2022-12-11 LAB — COMPREHENSIVE METABOLIC PANEL
ALT: 21 IU/L (ref 0–32)
AST: 19 IU/L (ref 0–40)
Albumin: 4.7 g/dL (ref 3.9–4.9)
Alkaline Phosphatase: 104 IU/L (ref 44–121)
BUN/Creatinine Ratio: 17 (ref 12–28)
BUN: 12 mg/dL (ref 8–27)
Bilirubin Total: 1 mg/dL (ref 0.0–1.2)
CO2: 23 mmol/L (ref 20–29)
Calcium: 9.7 mg/dL (ref 8.7–10.3)
Chloride: 103 mmol/L (ref 96–106)
Creatinine, Ser: 0.71 mg/dL (ref 0.57–1.00)
Globulin, Total: 2 g/dL (ref 1.5–4.5)
Glucose: 101 mg/dL — ABNORMAL HIGH (ref 70–99)
Potassium: 4.1 mmol/L (ref 3.5–5.2)
Sodium: 141 mmol/L (ref 134–144)
Total Protein: 6.7 g/dL (ref 6.0–8.5)
eGFR: 95 mL/min/{1.73_m2} (ref >59)

## 2022-12-11 LAB — CBC/DIFF AMBIGUOUS DEFAULT
Basophils Absolute: 0 10*3/uL (ref 0.0–0.2)
Basos: 1 %
EOS (ABSOLUTE): 0.1 10*3/uL (ref 0.0–0.4)
Eos: 2 %
Hematocrit: 44 % (ref 34.0–46.6)
Hemoglobin: 14.7 g/dL (ref 11.1–15.9)
Immature Grans (Abs): 0 10*3/uL (ref 0.0–0.1)
Immature Granulocytes: 0 %
Lymphocytes Absolute: 2 10*3/uL (ref 0.7–3.1)
Lymphs: 36 %
MCH: 29.6 pg (ref 26.6–33.0)
MCHC: 33.4 g/dL (ref 31.5–35.7)
MCV: 89 fL (ref 79–97)
Monocytes Absolute: 0.5 10*3/uL (ref 0.1–0.9)
Monocytes: 9 %
Neutrophils Absolute: 2.9 10*3/uL (ref 1.4–7.0)
Neutrophils: 52 %
Platelets: 211 10*3/uL (ref 150–450)
RBC: 4.96 x10E6/uL (ref 3.77–5.28)
RDW: 13.1 % (ref 11.7–15.4)
WBC: 5.6 10*3/uL (ref 3.4–10.8)

## 2022-12-11 LAB — LIPID PANEL W/O CHOL/HDL RATIO
Cholesterol, Total: 169 mg/dL (ref 100–199)
HDL: 73 mg/dL (ref >39)
LDL Chol Calc (NIH): 80 mg/dL (ref 0–99)
Triglycerides: 85 mg/dL (ref 0–149)
VLDL Cholesterol Cal: 16 mg/dL (ref 5–40)

## 2022-12-11 LAB — SPECIMEN STATUS REPORT

## 2022-12-11 LAB — TSH: TSH: 0.45 u[IU]/mL (ref 0.450–4.500)

## 2022-12-13 NOTE — Progress Notes (Signed)
No concerns with labs.  Cholesterol looks great.

## 2023-01-10 ENCOUNTER — Telehealth: Payer: Self-pay | Admitting: Physician Assistant

## 2023-01-10 ENCOUNTER — Telehealth: Payer: Self-pay

## 2023-01-10 NOTE — Telephone Encounter (Signed)
Initiated Prior authorization ZOX:[id number] 10MG  tablets Via: Covermymeds Case/Key:[id number]  Status: approved as of 01/10/23 Reason:The authorization is valid from 12/11/2022 through 01/10/2024 Notified Pt via: Mychart

## 2023-01-10 NOTE — Telephone Encounter (Signed)
Patient needs PA for Farxiga 10mg   [facility] [place]  [phone] Please advise

## 2023-01-21 IMAGING — CT CT CHEST LUNG CANCER SCREENING LOW DOSE W/O CM
1 series · 10 of 10 positions shown, 13 images · non-contrast
Comparison: CT lung cancer screening dated December 20, 2018

CLINICAL DATA: Current smoker with 32 pack-year history

EXAM:
CT CHEST WITHOUT CONTRAST LOW-DOSE FOR LUNG CANCER SCREENING
TECHNIQUE: Multidetector CT imaging of the chest was performed following the
standard protocol without IV contrast.

[ct lung segmentation data · axial · 0.63mm/px · z∈[+1226,+1226]mm · 10 of 287 frames shown]
[frame 1/287  mediastinal]
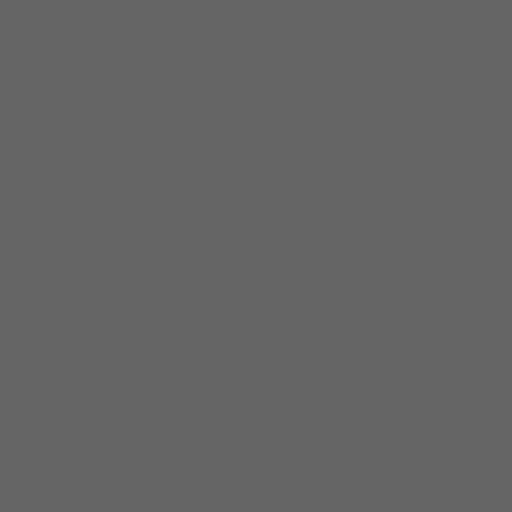
[frame 1/287  lung]
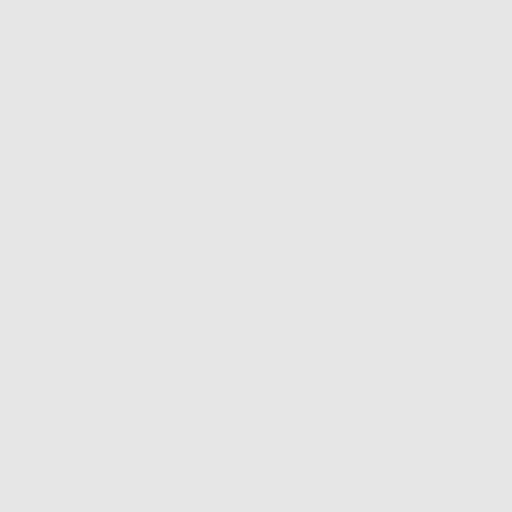
[frame 32/287  lung]
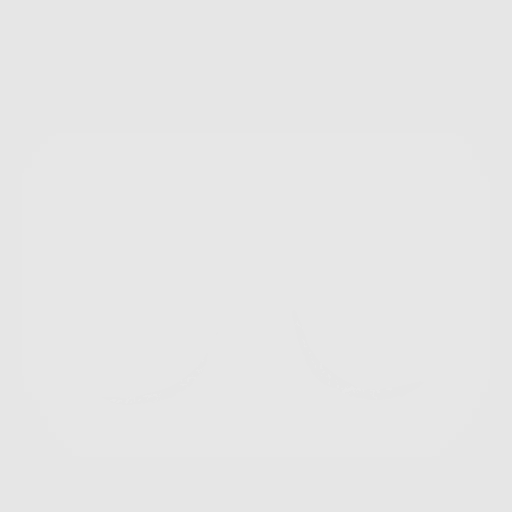
[frame 64/287  lung]
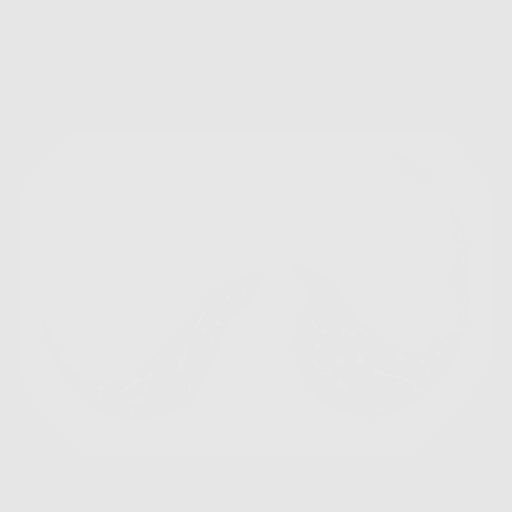
[frame 96/287  lung]
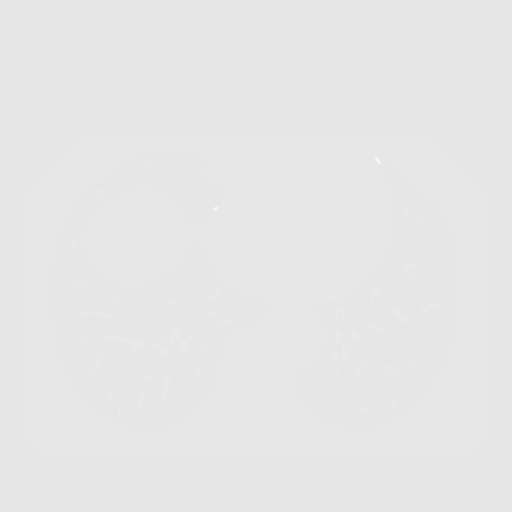
[frame 128/287  mediastinal]
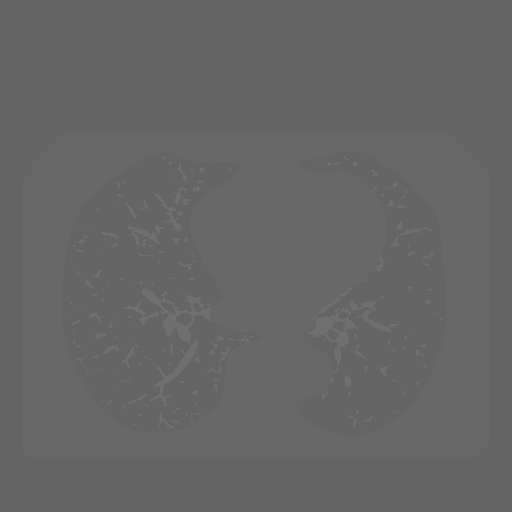
[frame 128/287  lung]
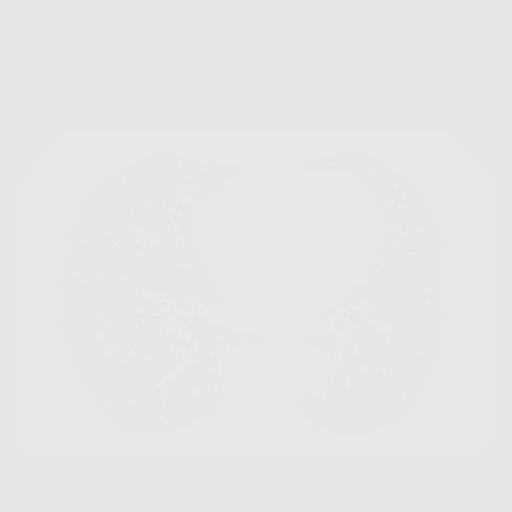
[frame 159/287  lung]
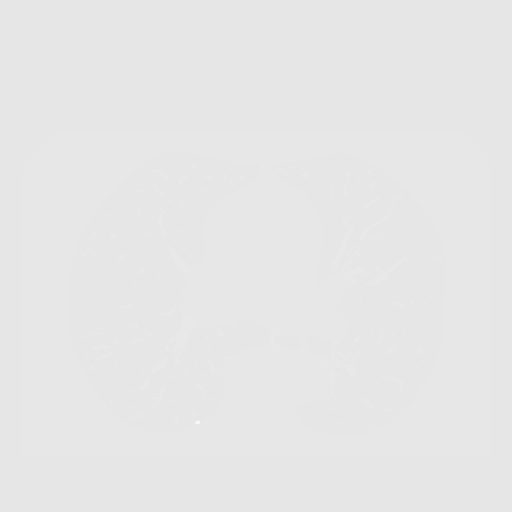
[frame 191/287  lung]
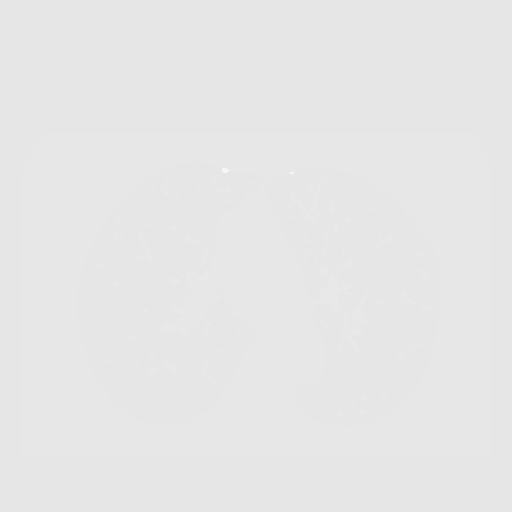
[frame 223/287  lung]
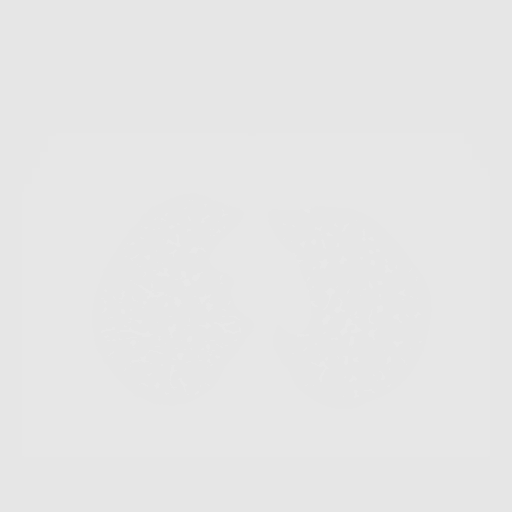
[frame 255/287  mediastinal]
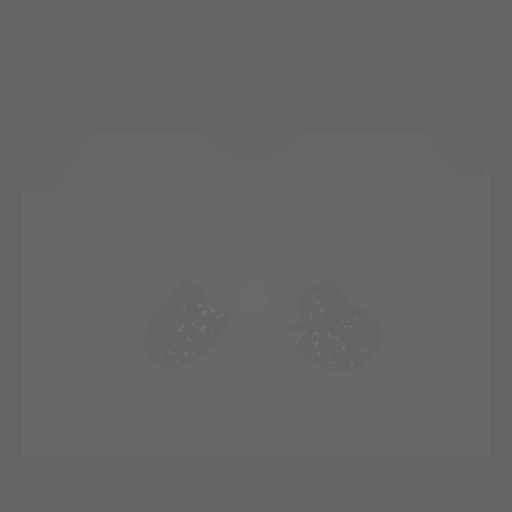
[frame 255/287  lung]
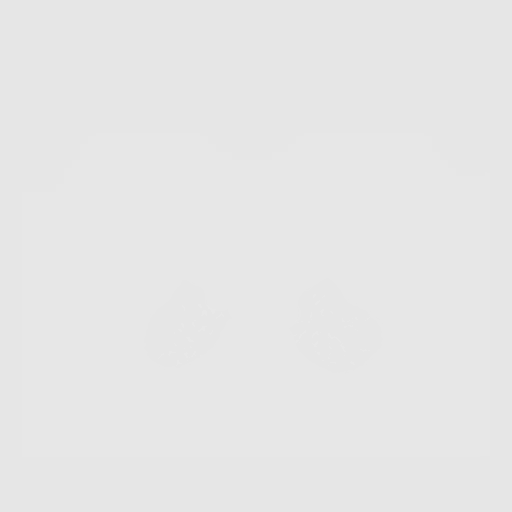
[frame 287/287  lung]
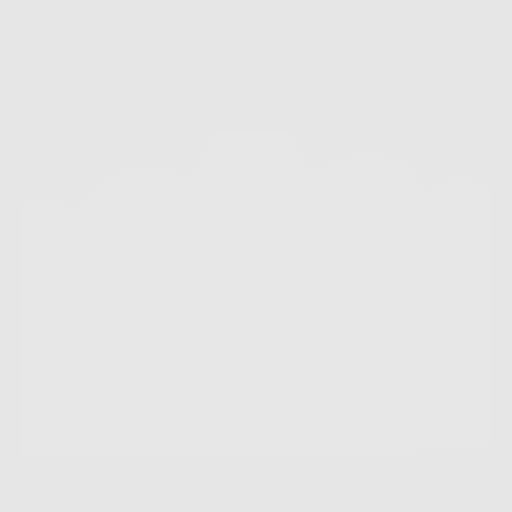

[10 of 10 positions shown; findings below may reference images not displayed]

FINDINGS: Cardiovascular: No pericardial effusion. No significant coronary
artery calcifications. Mild calcified plaque of the thoracic aorta.

Mediastinum/Nodes: Thyroid and esophagus are unremarkable. No
pathologically enlarged lymph nodes seen in the chest.

Lungs/Pleura: Central airways are patent. Mild upper lobe
predominant centrilobular emphysema. No consolidation, pleural
effusion or pneumothorax. Stable scattered solid pulmonary nodules.
Reference solid pulmonary nodule of the right middle lobe measuring
3.3 cm on series 3, image 137.

Upper Abdomen: No acute abnormality.

Musculoskeletal: No chest wall mass or suspicious bone lesions
identified.
IMPRESSION: Lung-RADS 2, benign appearance or behavior. Continue annual
screening with low-dose chest CT without contrast in 12 months.

Aortic Atherosclerosis (LBPQT-3NG.G) and Emphysema (LBPQT-Z6G.5).

## 2023-01-31 ENCOUNTER — Telehealth: Payer: Self-pay

## 2023-01-31 NOTE — Telephone Encounter (Signed)
Patient informed and will call and schedule the DM eye exam.

## 2023-01-31 NOTE — Telephone Encounter (Signed)
Attempted call to pt to remind her of need for DM eye exam . Left a voice mail message requesting a return call.

## 2023-03-14 DIAGNOSIS — Z79899 Other long term (current) drug therapy: Secondary | ICD-10-CM | POA: Diagnosis not present

## 2023-03-14 DIAGNOSIS — M961 Postlaminectomy syndrome, not elsewhere classified: Secondary | ICD-10-CM | POA: Diagnosis not present

## 2023-03-14 DIAGNOSIS — M533 Sacrococcygeal disorders, not elsewhere classified: Secondary | ICD-10-CM | POA: Diagnosis not present

## 2023-03-15 ENCOUNTER — Encounter: Payer: Self-pay | Admitting: Physician Assistant

## 2023-03-15 ENCOUNTER — Ambulatory Visit: Payer: Federal, State, Local not specified - PPO | Admitting: Physician Assistant

## 2023-03-15 VITALS — BP 112/74 | HR 80 | Ht 69.5 in | Wt 170.2 lb

## 2023-03-15 DIAGNOSIS — I10 Essential (primary) hypertension: Secondary | ICD-10-CM

## 2023-03-15 DIAGNOSIS — E1165 Type 2 diabetes mellitus with hyperglycemia: Secondary | ICD-10-CM

## 2023-03-15 DIAGNOSIS — R1011 Right upper quadrant pain: Secondary | ICD-10-CM | POA: Diagnosis not present

## 2023-03-15 DIAGNOSIS — G43009 Migraine without aura, not intractable, without status migrainosus: Secondary | ICD-10-CM

## 2023-03-15 DIAGNOSIS — E785 Hyperlipidemia, unspecified: Secondary | ICD-10-CM | POA: Diagnosis not present

## 2023-03-15 DIAGNOSIS — Z7984 Long term (current) use of oral hypoglycemic drugs: Secondary | ICD-10-CM

## 2023-03-15 LAB — POCT GLYCOSYLATED HEMOGLOBIN (HGB A1C): HbA1c, POC (controlled diabetic range): 6.4 % (ref 0.0–7.0)

## 2023-03-15 MED ORDER — PROMETHAZINE HCL 25 MG PO TABS: 25.0000 mg | ORAL_TABLET | ORAL | 5 refills | Status: AC | PRN

## 2023-03-15 NOTE — Progress Notes (Unsigned)
Established Patient Office Visit  Subjective   Patient ID: Monica Brooks, female    DOB: 1961-02-12  Age: 62 y.o. MRN: [id number]  Chief Complaint  Patient presents with   Hypertension   Diabetes    HPI Pt is a 61 yo female with T2DM, HTN, HLD, Migraines, IBS, Chronic low back pain who presents to the clinic for follow up.   She is not checking her sugars. She is compliant with medications. She is active and watches her grandson regularly. She denies any hypoglycemic events. No open sores or wounds. Denies any CP, palpitations, headaches or vision changes.   Migraines controlled.   Chronic pain has been worse lately. She is on prednisone to help with flare.   She does report right upper abdominal pain for the last month. No known injury. Does not hurt to touch but almost a constant ache. Not done anything to make better. Denies any reflux, constipation, diarrhea, vomiting. Pain does not radiate. Movement makes worse. Food does not effect pain.    .. Active Ambulatory Problems    Diagnosis Date Noted   Hyperlipidemia LDL goal <70 09/15/2007   Esophageal reflux 09/15/2007   IRRITABLE BOWEL SYNDROME, HX OF 09/15/2007   Hematemesis 11/04/2012   Syncope 11/04/2012   Stricture and stenosis of esophagus 11/04/2012   Lumbar disc herniation 10/08/2015   Migraine without aura and with status migrainosus, not intractable 10/08/2015   Primary osteoarthritis involving multiple joints 10/11/2015   Lumbar radiculopathy    Hematuria 04/23/2016   Low iron stores 04/25/2016   Elevated liver enzymes 04/25/2016   Current smoker 11/06/2017   Essential hypertension 10/30/2018   Emphysema lung (HCC) 12/27/2018   Pre-diabetes 01/19/2019   Periumbilical mass 06/28/2019   DDD (degenerative disc disease), lumbar 07/02/2019   Facet arthropathy 07/02/2019   Acute right-sided low back pain without sciatica 07/02/2019   Post laminectomy syndrome 07/02/2019   Sacroiliac joint pain 07/02/2019    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   Post-menopausal 02/13/2020   Chronic constipation 02/13/2020   Hyperplastic rectal polyp 04/08/2020   Grief at loss of child 02/17/2021   Nausea 02/17/2021   Type 2 diabetes mellitus with hyperglycemia, without long-term current use of insulin (HCC) 03/23/2021   Aortic atherosclerosis (HCC) 09/01/2021   Migraine without aura and without status migrainosus, not intractable 10/05/2021   Arthritis 07/02/2022   Refractory migraine with aura 07/02/2022   Right upper quadrant abdominal pain 03/15/2023   Resolved Ambulatory Problems    Diagnosis Date Noted   No Resolved Ambulatory Problems   Past Medical History:  Diagnosis Date   Allergy    Anemia    Anxiety    Chronic headaches    Colon polyps    Esophagitis 1992   GERD (gastroesophageal reflux disease)    HLD (hyperlipidemia)    HTN (hypertension)    IBS (irritable bowel syndrome)    Sliding hiatal hernia 1992   Stricture of esophagus 1992   Ulcer     ROS See HPI.    Objective:     BP 112/74   Pulse 80   Ht 5' 9.5" (1.765 m)   Wt 170 lb 4 oz (77.2 kg)   SpO2 99%   BMI 24.78 kg/m  BP Readings from Last 3 Encounters:  03/15/23 112/74  12/07/22 (!) 140/82  07/14/22 128/76   Wt Readings from Last 3 Encounters:  03/15/23 170 lb 4 oz (77.2 kg)  12/07/22 177 lb (80.3 kg)  07/14/22 191 lb (86.6 kg)    .Marland Kitchen  03/15/2023    2:17 PM 07/14/2022    2:25 PM 03/29/2022    3:52 PM 03/29/2022    3:44 PM 02/16/2021    4:16 PM  Depression screen PHQ 2/9  Decreased Interest 0 0 0 0 1  Down, Depressed, Hopeless 0 0 0 0 1  PHQ - 2 Score 0 0 0 0 2  Altered sleeping   0  2  Tired, decreased energy   2  1  Change in appetite   0  1  Feeling bad or failure about yourself    0  0  Trouble concentrating   0  1  Moving slowly or fidgety/restless   0  0  Suicidal thoughts   0  0  PHQ-9 Score   2  7  Difficult doing work/chores   Somewhat difficult  Somewhat difficult    .Marland Kitchen Results  for orders placed or performed in visit on 03/15/23  POCT HgB A1C  Result Value Ref Range   Hemoglobin A1C     HbA1c POC (<> result, manual entry)     HbA1c, POC (prediabetic range)     HbA1c, POC (controlled diabetic range) 6.4 0.0 - 7.0 %      Physical Exam Constitutional:      Appearance: Normal appearance.  HENT:     Head: Normocephalic.  Cardiovascular:     Rate and Rhythm: Normal rate and regular rhythm.     Pulses: Normal pulses.     Heart sounds: Normal heart sounds.  Pulmonary:     Effort: Pulmonary effort is normal.     Breath sounds: Normal breath sounds.  Abdominal:     General: There is no distension.     Palpations: Abdomen is soft. There is no mass.     Tenderness: There is no abdominal tenderness. There is no right CVA tenderness, left CVA tenderness, guarding or rebound.     Hernia: No hernia is present.     Comments: Right upper quadrant pain more towards flank but not with palpation. No masses.   Musculoskeletal:     Cervical back: Normal range of motion and neck supple. No tenderness.     Right lower leg: No edema.     Left lower leg: No edema.  Lymphadenopathy:     Cervical: No cervical adenopathy.  Neurological:     General: No focal deficit present.     Mental Status: She is alert and oriented to person, place, and time.  Psychiatric:        Mood and Affect: Mood normal.       The 10-year ASCVD risk score (Arnett DK, et al., 2019) is: 11.4%    Assessment & Plan:  Marland KitchenMarland KitchenRinaldo Cloud "Pam" was seen today for hypertension and diabetes.  Diagnoses and all orders for this visit:  Type 2 diabetes mellitus with hyperglycemia, without long-term current use of insulin (HCC) -     POCT HgB A1C  Essential hypertension  Hyperlipidemia LDL goal <70  Right upper quadrant abdominal pain -     CMP14+EGFR -     CBC w/Diff/Platelet -     Lipase -     US Abdomen Complete; Future  Migraine without aura and without status migrainosus, not intractable -      promethazine (PHENERGAN) 25 MG tablet; Take 1 tablet (25 mg total) by mouth as needed for nausea or vomiting.   A1C to goal Continue same medications BP to goal On statin .Marland Kitchen Diabetic Foot Exam - Simple  Simple Foot Form Diabetic Foot exam was performed with the following findings: Yes 03/15/2023  2:15 PM  Visual Inspection See comments: Yes Sensation Testing Intact to touch and monofilament testing bilaterally: Yes Pulse Check Posterior Tibialis and Dorsalis pulse intact bilaterally: Yes Comments No deformities , or ulcerations or skin breakdown bilaterally but Left foot shows suture healed from previous surgery    Needs eye exam Declined flu and covid vaccine today but stated will get later in season  Migraines controlled phenergan to use for nausea as needed  Right upper abdominal pain but no tenderness to palpation CMP/CBC/lipase ordered today U/S ordered of abdomen No acute red flag symptoms today Make sure having good full bowel movements ? MSK gentle massage and stretching with warm compress over abdomen  Return in about 3 months (around 06/14/2023).    Tandy Gaw, PA-C

## 2023-03-16 NOTE — Progress Notes (Signed)
Kidney, liver look good.  Lipase normal.  No concerns in labs.

## 2023-03-18 ENCOUNTER — Ambulatory Visit: Payer: Federal, State, Local not specified - PPO

## 2023-03-18 DIAGNOSIS — R160 Hepatomegaly, not elsewhere classified: Secondary | ICD-10-CM | POA: Diagnosis not present

## 2023-03-18 DIAGNOSIS — R932 Abnormal findings on diagnostic imaging of liver and biliary tract: Secondary | ICD-10-CM | POA: Diagnosis not present

## 2023-03-18 DIAGNOSIS — R1011 Right upper quadrant pain: Secondary | ICD-10-CM | POA: Diagnosis not present

## 2023-03-18 DIAGNOSIS — N2889 Other specified disorders of kidney and ureter: Secondary | ICD-10-CM | POA: Diagnosis not present

## 2023-03-21 ENCOUNTER — Ambulatory Visit: Payer: Federal, State, Local not specified - PPO | Admitting: Physician Assistant

## 2023-03-21 NOTE — Progress Notes (Signed)
Gallbladder lesion but same as last u/s Liver lesion appears benign hemangioma  Kidney cyst suggest MRI of abdomen to get a better view-where would you like this scheduled?

## 2023-03-23 ENCOUNTER — Encounter: Payer: Self-pay | Admitting: Physician Assistant

## 2023-03-23 DIAGNOSIS — N2889 Other specified disorders of kidney and ureter: Secondary | ICD-10-CM

## 2023-03-25 NOTE — Telephone Encounter (Signed)
Order placed.  Please proceed with referral as patient's information below. Sending to [facility] since working on Imaging PA's today

## 2023-03-29 NOTE — Telephone Encounter (Signed)
Patient left message on voicemail stating referral orders need to be sent to Imaging facility in [place] instead of [facility].  Returned patients voicemail and left message letting patient know that I would forward message.

## 2023-03-29 NOTE — Telephone Encounter (Signed)
Panya,   Can you send patients imaging orders to the requested facility below;  Please fax a referral for the MRI to:   [facility] [place], [place] Fax # [phone]   After they receive the referral, they will call me to schedule the MRI. Please send me your fax number in order for them to send you the results. Thank you.    Sincerely, Marinell Blight

## 2023-04-11 ENCOUNTER — Ambulatory Visit: Payer: Federal, State, Local not specified - PPO

## 2023-04-13 ENCOUNTER — Encounter: Payer: Self-pay | Admitting: Physician Assistant

## 2023-04-13 DIAGNOSIS — R160 Hepatomegaly, not elsewhere classified: Secondary | ICD-10-CM | POA: Insufficient documentation

## 2023-04-13 NOTE — Progress Notes (Signed)
MRI- no renal cyst to be concerned with. Evidence of some constipation. Which if need helping working on we can. Evidence of liver enlargement.

## 2023-04-15 MED ORDER — LINACLOTIDE 290 MCG PO CAPS: 290.0000 ug | ORAL_CAPSULE | Freq: Every day | ORAL | 1 refills | Status: AC

## 2023-06-20 ENCOUNTER — Ambulatory Visit: Payer: Federal, State, Local not specified - PPO | Admitting: Physician Assistant

## 2024-01-13 ENCOUNTER — Encounter: Payer: Self-pay | Admitting: Advanced Practice Midwife

## 2024-02-22 ENCOUNTER — Telehealth: Payer: Self-pay

## 2024-02-22 NOTE — Telephone Encounter (Signed)
 FYI  I called patient to check to see when her last eye exam was and she stated she moved to [place] . I took Vermell off as her PCP.
